# Patient Record
Sex: Female | Born: 1970 | Race: White | Hispanic: No | Marital: Married | Smoking: Never smoker
Health system: Southern US, Community
[De-identification: ages and names within clinical notes are randomized; demographics above are authoritative.]

## PROBLEM LIST (undated history)

## (undated) DIAGNOSIS — F40298 Other specified phobia: Secondary | ICD-10-CM

## (undated) DIAGNOSIS — IMO0001 Reserved for inherently not codable concepts without codable children: Secondary | ICD-10-CM

## (undated) DIAGNOSIS — E039 Hypothyroidism, unspecified: Secondary | ICD-10-CM

## (undated) DIAGNOSIS — F419 Anxiety disorder, unspecified: Secondary | ICD-10-CM

## (undated) DIAGNOSIS — Z8489 Family history of other specified conditions: Secondary | ICD-10-CM

## (undated) DIAGNOSIS — D649 Anemia, unspecified: Secondary | ICD-10-CM

## (undated) DIAGNOSIS — E079 Disorder of thyroid, unspecified: Secondary | ICD-10-CM

---

## 1997-10-30 ENCOUNTER — Encounter: Admission: RE | Admit: 1997-10-30 | Discharge: 1997-10-30 | Payer: Self-pay | Admitting: *Deleted

## 1999-04-29 ENCOUNTER — Other Ambulatory Visit: Admission: RE | Admit: 1999-04-29 | Discharge: 1999-04-29 | Payer: Self-pay | Admitting: Obstetrics & Gynecology

## 2000-05-30 ENCOUNTER — Other Ambulatory Visit: Admission: RE | Admit: 2000-05-30 | Discharge: 2000-05-30 | Payer: Self-pay | Admitting: Obstetrics & Gynecology

## 2001-06-28 ENCOUNTER — Other Ambulatory Visit: Admission: RE | Admit: 2001-06-28 | Discharge: 2001-06-28 | Payer: Self-pay | Admitting: Obstetrics & Gynecology

## 2002-07-03 ENCOUNTER — Other Ambulatory Visit: Admission: RE | Admit: 2002-07-03 | Discharge: 2002-07-03 | Payer: Self-pay | Admitting: Obstetrics & Gynecology

## 2005-01-15 ENCOUNTER — Emergency Department (HOSPITAL_COMMUNITY): Admission: EM | Admit: 2005-01-15 | Discharge: 2005-01-15 | Payer: Self-pay | Admitting: Emergency Medicine

## 2005-01-16 ENCOUNTER — Ambulatory Visit (HOSPITAL_COMMUNITY): Admission: RE | Admit: 2005-01-16 | Discharge: 2005-01-16 | Payer: Self-pay | Admitting: *Deleted

## 2005-03-22 ENCOUNTER — Inpatient Hospital Stay (HOSPITAL_COMMUNITY): Admission: RE | Admit: 2005-03-22 | Discharge: 2005-03-25 | Payer: Self-pay | Admitting: Obstetrics & Gynecology

## 2005-03-26 ENCOUNTER — Encounter: Admission: RE | Admit: 2005-03-26 | Discharge: 2005-04-25 | Payer: Self-pay | Admitting: Obstetrics & Gynecology

## 2005-04-26 ENCOUNTER — Encounter: Admission: RE | Admit: 2005-04-26 | Discharge: 2005-05-25 | Payer: Self-pay | Admitting: Obstetrics & Gynecology

## 2007-10-09 ENCOUNTER — Ambulatory Visit (HOSPITAL_COMMUNITY): Admission: RE | Admit: 2007-10-09 | Discharge: 2007-10-09 | Payer: Self-pay | Admitting: Obstetrics

## 2007-10-09 ENCOUNTER — Encounter (INDEPENDENT_AMBULATORY_CARE_PROVIDER_SITE_OTHER): Payer: Self-pay | Admitting: Obstetrics

## 2009-03-08 ENCOUNTER — Encounter: Admission: RE | Admit: 2009-03-08 | Discharge: 2009-03-08 | Payer: Self-pay | Admitting: Obstetrics & Gynecology

## 2009-03-15 ENCOUNTER — Encounter: Admission: RE | Admit: 2009-03-15 | Discharge: 2009-03-15 | Payer: Self-pay | Admitting: Obstetrics & Gynecology

## 2009-07-01 ENCOUNTER — Emergency Department (HOSPITAL_BASED_OUTPATIENT_CLINIC_OR_DEPARTMENT_OTHER): Admission: EM | Admit: 2009-07-01 | Discharge: 2009-07-01 | Payer: Self-pay | Admitting: Emergency Medicine

## 2009-07-08 ENCOUNTER — Encounter: Payer: Self-pay | Admitting: Cardiology

## 2009-07-08 ENCOUNTER — Ambulatory Visit: Payer: Self-pay

## 2010-05-03 NOTE — Procedures (Signed)
Summary: summary report  summary report   Imported By: Mirna Mires 07/15/2009 12:05:23  _____________________________________________________________________  External Attachment:    Type:   Image     Comment:   External Document

## 2010-06-21 ENCOUNTER — Other Ambulatory Visit: Payer: Self-pay | Admitting: Obstetrics & Gynecology

## 2010-06-21 DIAGNOSIS — Z1231 Encounter for screening mammogram for malignant neoplasm of breast: Secondary | ICD-10-CM

## 2010-06-27 LAB — DIFFERENTIAL
Basophils Absolute: 0 10*3/uL (ref 0.0–0.1)
Basophils Relative: 0 % (ref 0–1)
Eosinophils Absolute: 0 10*3/uL (ref 0.0–0.7)
Eosinophils Relative: 0 % (ref 0–5)
Lymphocytes Relative: 21 % (ref 12–46)
Lymphs Abs: 2.1 10*3/uL (ref 0.7–4.0)
Monocytes Absolute: 0.4 10*3/uL (ref 0.1–1.0)
Monocytes Relative: 4 % (ref 3–12)
Neutro Abs: 7.5 10*3/uL (ref 1.7–7.7)
Neutrophils Relative %: 74 % (ref 43–77)

## 2010-06-27 LAB — POCT CARDIAC MARKERS
CKMB, poc: <1 ng/mL — ABNORMAL LOW (ref 1.0–8.0)
Myoglobin, poc: 47.6 ng/mL (ref 12–200)
Troponin i, poc: <0.05 ng/mL (ref 0.00–0.09)

## 2010-06-27 LAB — T3, FREE: T3, Free: 3.2 pg/mL (ref 2.3–4.2)

## 2010-06-27 LAB — BASIC METABOLIC PANEL
BUN: 11 mg/dL (ref 6–23)
CO2: 28 mEq/L (ref 19–32)
Calcium: 9.6 mg/dL (ref 8.4–10.5)
Chloride: 104 mEq/L (ref 96–112)
Creatinine, Ser: 0.6 mg/dL (ref 0.4–1.2)
GFR calc Af Amer: >60 mL/min (ref >60)
GFR calc non Af Amer: >60 mL/min (ref >60)
Glucose, Bld: 102 mg/dL — ABNORMAL HIGH (ref 70–99)
Potassium: 3.7 mEq/L (ref 3.5–5.1)
Sodium: 140 mEq/L (ref 135–145)

## 2010-06-27 LAB — TSH: TSH: 2.904 u[IU]/mL (ref 0.350–4.500)

## 2010-06-27 LAB — CBC
HCT: 34.3 % — ABNORMAL LOW (ref 36.0–46.0)
Hemoglobin: 12 g/dL (ref 12.0–15.0)
MCHC: 35 g/dL (ref 30.0–36.0)
MCV: 82.7 fL (ref 78.0–100.0)
Platelets: 314 10*3/uL (ref 150–400)
RBC: 4.15 MIL/uL (ref 3.87–5.11)
RDW: 14.5 % (ref 11.5–15.5)
WBC: 10 10*3/uL (ref 4.0–10.5)

## 2010-06-27 LAB — T4: T4, Total: 7.9 ug/dL (ref 5.0–12.5)

## 2010-06-29 ENCOUNTER — Ambulatory Visit
Admission: RE | Admit: 2010-06-29 | Discharge: 2010-06-29 | Disposition: A | Discharge status: Home / Self Care | Payer: BC Managed Care – PPO | Source: Ambulatory Visit | Attending: Obstetrics & Gynecology | Admitting: Obstetrics & Gynecology

## 2010-06-29 DIAGNOSIS — Z1231 Encounter for screening mammogram for malignant neoplasm of breast: Secondary | ICD-10-CM

## 2010-07-01 ENCOUNTER — Other Ambulatory Visit: Payer: Self-pay | Admitting: Obstetrics & Gynecology

## 2010-07-01 DIAGNOSIS — R928 Other abnormal and inconclusive findings on diagnostic imaging of breast: Secondary | ICD-10-CM

## 2010-07-14 ENCOUNTER — Ambulatory Visit
Admission: RE | Admit: 2010-07-14 | Discharge: 2010-07-14 | Disposition: A | Discharge status: Home / Self Care | Payer: BC Managed Care – PPO | Source: Ambulatory Visit | Attending: Obstetrics & Gynecology | Admitting: Obstetrics & Gynecology

## 2010-07-14 DIAGNOSIS — R928 Other abnormal and inconclusive findings on diagnostic imaging of breast: Secondary | ICD-10-CM

## 2010-08-16 NOTE — Op Note (Signed)
[name], [name]           ACCOUNT NO.:  [id number]   MEDICAL RECORD NO.:  [id number]          PATIENT TYPE:  AMB   LOCATION:  SDC                           FACILITY:  [facility]   PHYSICIAN:  Lendon Colonel, MD   DATE OF BIRTH:  15-Feb-1971   DATE OF PROCEDURE:  DATE OF DISCHARGE:                               OPERATIVE REPORT   PREOPERATIVE DIAGNOSIS:  15-week intrauterine fetal demise.   POSTOPERATIVE DIAGNOSIS:  15-week intrauterine fetal demise.   PROCEDURE:  Suction dilatation and evacuation.   SURGEON:  Lendon Colonel, MD   ASSISTANTS:  None.   ANESTHESIA:  LMA.   FINDINGS:  A 15-week uterus, normal endometrial stripe postprocedure.   DISPOSITION:  Specimens to Pathology.   ESTIMATED BLOOD LOSS:  200 mL.   COMPLICATIONS:  None.   ANTIBIOTICS:  A 100 mg of IV doxycycline.   INDICATIONS:  This is a 40 year old G3, P2 diagnosed with a 15-week  missed AB, one day prior to procedure.  After risks, benefits, and  alternatives of the procedure was discussed, the patient opted for  suction D&E.   PROCEDURE IN DETAIL:  After informed consent was obtained, the patient  was taken to the operating room where LMA anesthesia was administered  without difficulty.  She was prepped and draped in normal sterile  fashion in the dorsal supine lithotomy position.  A bimanual examination  was performed to assess the size and position of the uterus and with  this expression of the 15-week fetus was noted. It was expressed intact.  The cord was clamped and cut, moderate bleeding was noted from the  uterus.  A sterile speculum was inserted into the vagina.  A single-  tooth tenaculum was used to grasp the anterior lip of the cervix.  The  cervix was easily dilated to a #35 Shawnie Pons and a #10 French suction  curette was inserted into the uterus and with several passes moderate  amount of placental tissue was obtained.  The bleeding was still noted  from the cervix.  A very gentle sharp  curettage was carried out, which  revealed a gritty cry , however, due to the amount of bleeding  ultrasound was called for.  Ultrasound assistance arrived into the room.  A small area of clot and questionable retained POCs was noted with some  active blood flow, a repeat suction curettage was carried out.  Improved  hemostasis was noted.  The endometrial stripe was noted to be  8 mm and no active blood flow to the uterus.  The procedure was then  terminated.  Sponge, lap, and needle counts were correct x3.  The  patient did receive 0.2 mg of IM Methergine prior to ultrasound arrival  which helped with hemostasis.  The patient was awakened and taken to the  recovery room in a stable condition.      Lendon Colonel, MD  Electronically Signed     KAF/MEDQ  D:  10/09/2007  T:  10/10/2007  Job:  [id number]

## 2010-08-19 NOTE — Discharge Summary (Signed)
[name], Tina Caldwell           ACCOUNT NO.:  [id number]   MEDICAL RECORD NO.:  [id number]          PATIENT TYPE:  INP   LOCATION:  9113                          FACILITY:  [facility]   PHYSICIAN:  Genia Del, M.D.DATE OF BIRTH:  1970-05-05   DATE OF ADMISSION:  03/22/2005  DATE OF DISCHARGE:  03/25/2005                                 DISCHARGE SUMMARY   ADMISSION DIAGNOSIS:  Thirty-eight-plus weeks with history of shoulder  dystocia.   DISCHARGE DIAGNOSES:  Thirty-eight-plus weeks with history of shoulder  dystocia.   INTERVENTION:  Primary elective low transverse cesarean section.   HOSPITAL COURSE:  The patient was admitted on the day of her surgery.  She  had a baby boy.  Apgars were 9 and 9.  Weight was 8 pounds 7 ounces.  Estimated blood loss was 800 mL.  No complications occurred during surgery.  Postoperative evaluation was unremarkable.  She had a postoperative  hemoglobin at 9.5.  She was discharged on postoperative day #3 with Tylox  p.r.n. Postoperative advice given.  She will follow up at the office in four  weeks.      Genia Del, M.D.  Electronically Signed     ML/MEDQ  D:  05/03/2005  T:  05/03/2005  Job:  [id number]

## 2010-08-19 NOTE — Op Note (Signed)
Tina Caldwell           ACCOUNT NO.:  [id number]   MEDICAL RECORD NO.:  [id number]          PATIENT TYPE:  INP   LOCATION:  9113                          [facility]ACILITY:  [facility]   PHYSICIAN:  Genia Del, M.D.DATE OF BIRTH:  [birth year]   DATE OF PROCEDURE:  03/22/2005  DATE OF DISCHARGE:                                 OPERATIVE REPORT   PREOPERATIVE DIAGNOSES:  1.  Thirty-eight-plus weeks gestation.  2.  History of previous shoulder dystocia.   POSTOPERATIVE DIAGNOSES:  1.  Thirty-eight-plus weeks gestation.  2.  History of previous shoulder dystocia.   OPERATION/PROCEDURE:  Primary elective low transverse cesarean section.   SURGEON:  Genia Del, M.D.   ASSISTANT:  Maxie Better, M.D.   ANESTHESIOLOGIST:  Burnett Corrente, M.D.   DESCRIPTION OF PROCEDURE:  Under spinal anesthesia, the patient is in 15-  degree left decubitus position.  She is prepped with Betadine on the  abdominal, suprapubic, vulvar, and vaginal areas and draped as usual.  The  bladder catheter is inserted.  We verified the level of anesthesia and it is  adequate.  We infiltrate 15 mL of Marcaine 25% plain at the level of the  future Pfannenstiel incision.  We then make a Pfannenstiel incision with a  scalpel.  We open the adipose tissue with a scalpel and then open the  aponeurosis transversely with the Mayo scissors.  We separate the  aponeurosis from the rectus muscles superiorly and inferiorly over the  midline.  We opened a parietal peritoneum longitudinally with the Metzenbaum  scissors.  We then open the visceral peritoneum transversely over the lower  uterine segment with Metzenbaum scissors.  The bladder is reclined downward.  The bladder retractor is put in place.  We made a low transverse hysterotomy  with a scalpel. Extension is done on each side with dressing scissors.  The  amniotic fluid is clear.  The fetus is in cephalic presentation.  Birth of a  baby boy at 1716  hours.  We notice a loose nuchal cord as well as a true  knot in the cord.  The cord is clamped and cut.  The baby was suctioned  The  baby is brought to the neonatal team. Apgars 9 and 9.  Weight 8 pounds 7  ounces.  Ancef 1 g IV was started in the IV fluids.  The placenta is  evacuated spontaneously.  It is complete with three vessels.  Uterine  revision is done.  Pitocin is started in the IV fluid.  We verify both  ovaries and tubes and they are all normal in size and appearance.  The  uterus is well-contracted.  We closed the hysterotomy with a first locked  running suture of Vicryl #0.  We make a second plain with a mattress stitch  of Vicryl #0 and one X stitch of Vicryl #0 is used toward the right side to  complete hemostasis.  We then irrigate and suction the abdominopelvic  cavity.  We verify hemostasis on the rectus muscles and bladder flap and it  is completed with the electrocautery where necessary.  The hysterotomy was  well closed and hemostasis is good at that level.  We then reapproximate the  rectus muscles on the midline with interrupted Vicryl #0 stitches and we  closed the aponeurosis with two half-running sutures of Vicryl #0.  Hemostasis  is completed on the adipose tissue with the electrocautery.  We then  reapproximate the skin with staples.  A dry dressing is applied.  The count  of sponges and instruments was complete x2.  The estimated blood loss was  800 mL.  No complications occurred and the patient was brought to the  recovery room in good status.      Genia Del, M.D.  Electronically Signed     ML/MEDQ  D:  03/22/2005  T:  03/23/2005  Job:  [id number]

## 2010-12-12 ENCOUNTER — Emergency Department (HOSPITAL_BASED_OUTPATIENT_CLINIC_OR_DEPARTMENT_OTHER)
Admission: EM | Admit: 2010-12-12 | Discharge: 2010-12-12 | Disposition: A | Discharge status: Home / Self Care | Payer: BC Managed Care – PPO | Attending: Emergency Medicine | Admitting: Emergency Medicine

## 2010-12-12 ENCOUNTER — Emergency Department (INDEPENDENT_AMBULATORY_CARE_PROVIDER_SITE_OTHER): Payer: BC Managed Care – PPO

## 2010-12-12 ENCOUNTER — Other Ambulatory Visit: Payer: Self-pay

## 2010-12-12 ENCOUNTER — Encounter (HOSPITAL_BASED_OUTPATIENT_CLINIC_OR_DEPARTMENT_OTHER): Payer: Self-pay

## 2010-12-12 DIAGNOSIS — R0989 Other specified symptoms and signs involving the circulatory and respiratory systems: Secondary | ICD-10-CM | POA: Insufficient documentation

## 2010-12-12 DIAGNOSIS — R0602 Shortness of breath: Secondary | ICD-10-CM

## 2010-12-12 DIAGNOSIS — R0609 Other forms of dyspnea: Secondary | ICD-10-CM | POA: Insufficient documentation

## 2010-12-12 DIAGNOSIS — R079 Chest pain, unspecified: Secondary | ICD-10-CM | POA: Insufficient documentation

## 2010-12-12 DIAGNOSIS — E079 Disorder of thyroid, unspecified: Secondary | ICD-10-CM | POA: Insufficient documentation

## 2010-12-12 DIAGNOSIS — IMO0001 Reserved for inherently not codable concepts without codable children: Secondary | ICD-10-CM

## 2010-12-12 HISTORY — DX: Disorder of thyroid, unspecified: E07.9

## 2010-12-12 LAB — DIFFERENTIAL
Basophils Absolute: 0 10*3/uL (ref 0.0–0.1)
Basophils Relative: 0 % (ref 0–1)
Eosinophils Absolute: 0.1 10*3/uL (ref 0.0–0.7)
Eosinophils Relative: 1 % (ref 0–5)
Lymphocytes Relative: 23 % (ref 12–46)
Lymphs Abs: 2.2 10*3/uL (ref 0.7–4.0)
Monocytes Absolute: 0.4 10*3/uL (ref 0.1–1.0)
Monocytes Relative: 4 % (ref 3–12)
Neutro Abs: 6.7 10*3/uL (ref 1.7–7.7)
Neutrophils Relative %: 72 % (ref 43–77)

## 2010-12-12 LAB — BASIC METABOLIC PANEL
BUN: 9 mg/dL (ref 6–23)
CO2: 23 mEq/L (ref 19–32)
Calcium: 9.1 mg/dL (ref 8.4–10.5)
Chloride: 103 mEq/L (ref 96–112)
Creatinine, Ser: 0.6 mg/dL (ref 0.50–1.10)
GFR calc Af Amer: >60 mL/min (ref >60)
GFR calc non Af Amer: >60 mL/min (ref >60)
Glucose, Bld: 92 mg/dL (ref 70–99)
Potassium: 3.1 mEq/L — ABNORMAL LOW (ref 3.5–5.1)
Sodium: 139 mEq/L (ref 135–145)

## 2010-12-12 LAB — CBC
HCT: 35.6 % — ABNORMAL LOW (ref 36.0–46.0)
Hemoglobin: 11.9 g/dL — ABNORMAL LOW (ref 12.0–15.0)
MCH: 27.4 pg (ref 26.0–34.0)
MCHC: 33.4 g/dL (ref 30.0–36.0)
MCV: 81.8 fL (ref 78.0–100.0)
Platelets: 278 10*3/uL (ref 150–400)
RBC: 4.35 MIL/uL (ref 3.87–5.11)
RDW: 13.6 % (ref 11.5–15.5)
WBC: 9.4 10*3/uL (ref 4.0–10.5)

## 2010-12-12 LAB — D-DIMER, QUANTITATIVE: D-Dimer, Quant: 0.36 ug/mL-FEU (ref 0.00–0.48)

## 2010-12-12 LAB — CARDIAC PANEL(CRET KIN+CKTOT+MB+TROPI)
CK, MB: 1.8 ng/mL (ref 0.3–4.0)
Relative Index: INVALID (ref 0.0–2.5)
Total CK: 63 U/L (ref 7–177)
Troponin I: <0.3 ng/mL (ref <0.30)

## 2010-12-12 NOTE — ED Notes (Signed)
MD at bedside. 

## 2010-12-12 NOTE — ED Notes (Signed)
Pt reports relief from chest pain and SHOB after application of O2.  Pt and spouse informed of plan of care.

## 2010-12-12 NOTE — ED Notes (Signed)
Pt reports onset of [facility] yesterday while walking.  She reports intermittent chest pain radiating under left breast.

## 2010-12-12 NOTE — ED Provider Notes (Signed)
History     CSN: [id number] Arrival date & time: 12/12/2010 10:02 AM  Chief Complaint  Patient presents with  . Chest Pain  . Respiratory Distress   HPI Pt reports yesterday while walking to her car, she felt like she was breathing hard and fast. She felt better with rest, did not have any chest pain at the time. States she got home and began to feel like she was having a hard time getting air in and out and unable to take a deep breath. She began to have a mild aching lower chest pain, radiating to the L lateral chest under her breast. She was still having mild symptoms this morning and decided to come to the ED for eval. Her symptoms have resolved since arrival.   Past Medical History  Diagnosis Date  . Thyroid disease     Past Surgical History  Procedure Date  . Cesarean section     No family history on file.  History  Substance Use Topics  . Smoking status: Never Smoker   . Smokeless tobacco: Not on file  . Alcohol Use: Yes     occ    OB History    Grav Para Term Preterm Abortions TAB SAB Ect Mult Living                  Review of Systems All other systems reviewed and are negative except as noted in HPI.   Physical Exam  BP 137/72  Pulse 75  Temp(Src) 98.5 F (36.9 C) (Oral)  Resp 20  Ht 5\' 6"  (1.676 m)  Wt 250 lb (113.399 kg)  BMI 40.35 kg/m2  SpO2 98%  LMP 11/25/2010  Physical Exam  Nursing note and vitals reviewed. Constitutional: She is oriented to person, place, and time. She appears well-developed and well-nourished.  HENT:  Head: Normocephalic and atraumatic.  Eyes: EOM are normal. Pupils are equal, round, and reactive to light.  Neck: Normal range of motion. Neck supple.  Cardiovascular: Normal rate, normal heart sounds and intact distal pulses.   Pulmonary/Chest: Effort normal and breath sounds normal. She exhibits tenderness (lower sternum and L lateral lower ribs).  Abdominal: Bowel sounds are normal. She exhibits no distension. There is  no tenderness.  Musculoskeletal: Normal range of motion. She exhibits no edema and no tenderness.  Neurological: She is alert and oriented to person, place, and time. She has normal strength. No cranial nerve deficit or sensory deficit.  Skin: Skin is warm and dry. No rash noted.  Psychiatric: She has a normal mood and affect.    ED Course  Procedures  MDM  Date: 12/12/2010  Rate: 68  Rhythm: normal sinus rhythm  QRS Axis: normal  Intervals: normal  ST/T Wave abnormalities: normal  Conduction Disutrbances:none  Narrative Interpretation:   Old EKG Reviewed: unchanged from 01 Jul 2009  1:52 PM Pt remains pain free, no dyspnea at this time. Labs and imaging unremarkable. Doubt life threatening etiology of dyspnea. No CAD or PE risk factors. Recommended PCP followup for further eval, Advised to returned to the ED for any worsening or worrisome symptoms.        Charles B. Bernette Mayers, MD 12/12/10 1354

## 2010-12-29 LAB — CBC
HCT: 40.8
Hemoglobin: 13.8
MCHC: 33.8
MCV: 86.1
Platelets: 278
RBC: 4.74
RDW: 14.5
WBC: 10.1

## 2010-12-29 LAB — TYPE AND SCREEN
ABO/RH(D): O POS
Antibody Screen: NEGATIVE

## 2010-12-29 LAB — ABO/RH: ABO/RH(D): O POS

## 2011-05-26 ENCOUNTER — Other Ambulatory Visit: Payer: Self-pay | Admitting: Obstetrics & Gynecology

## 2011-05-26 DIAGNOSIS — Z1231 Encounter for screening mammogram for malignant neoplasm of breast: Secondary | ICD-10-CM

## 2011-06-27 ENCOUNTER — Ambulatory Visit
Admission: RE | Admit: 2011-06-27 | Discharge: 2011-06-27 | Disposition: A | Discharge status: Home / Self Care | Payer: BC Managed Care – PPO | Source: Ambulatory Visit | Attending: Obstetrics & Gynecology | Admitting: Obstetrics & Gynecology

## 2011-06-27 DIAGNOSIS — Z1231 Encounter for screening mammogram for malignant neoplasm of breast: Secondary | ICD-10-CM

## 2011-06-30 ENCOUNTER — Ambulatory Visit: Payer: BC Managed Care – PPO

## 2011-07-17 ENCOUNTER — Ambulatory Visit: Payer: BC Managed Care – PPO

## 2012-02-22 ENCOUNTER — Other Ambulatory Visit: Payer: Self-pay | Admitting: Gastroenterology

## 2012-02-22 DIAGNOSIS — R1033 Periumbilical pain: Secondary | ICD-10-CM

## 2012-02-27 ENCOUNTER — Ambulatory Visit
Admission: RE | Admit: 2012-02-27 | Discharge: 2012-02-27 | Disposition: A | Discharge status: Home / Self Care | Payer: BC Managed Care – PPO | Source: Ambulatory Visit | Attending: Gastroenterology | Admitting: Gastroenterology

## 2012-02-27 DIAGNOSIS — R1033 Periumbilical pain: Secondary | ICD-10-CM

## 2012-02-27 MED ORDER — IOHEXOL 300 MG/ML  SOLN
125.0000 mL | Freq: Once | INTRAMUSCULAR | Status: AC | PRN
  Administered 2012-02-27: 125 mL via INTRAVENOUS

## 2012-03-05 ENCOUNTER — Other Ambulatory Visit: Payer: Self-pay | Admitting: Gastroenterology

## 2012-03-05 DIAGNOSIS — R1011 Right upper quadrant pain: Secondary | ICD-10-CM

## 2013-06-12 ENCOUNTER — Other Ambulatory Visit: Payer: Self-pay | Admitting: Gastroenterology

## 2013-06-12 DIAGNOSIS — R1310 Dysphagia, unspecified: Secondary | ICD-10-CM

## 2013-06-17 ENCOUNTER — Ambulatory Visit
Admission: RE | Admit: 2013-06-17 | Discharge: 2013-06-17 | Disposition: A | Discharge status: Home / Self Care | Payer: Managed Care, Other (non HMO) | Source: Ambulatory Visit | Attending: Gastroenterology | Admitting: Gastroenterology

## 2013-06-17 DIAGNOSIS — R1310 Dysphagia, unspecified: Secondary | ICD-10-CM

## 2013-10-22 ENCOUNTER — Other Ambulatory Visit: Payer: Self-pay

## 2013-10-22 DIAGNOSIS — Z1231 Encounter for screening mammogram for malignant neoplasm of breast: Secondary | ICD-10-CM

## 2013-10-30 ENCOUNTER — Encounter (INDEPENDENT_AMBULATORY_CARE_PROVIDER_SITE_OTHER): Payer: Self-pay

## 2013-10-30 ENCOUNTER — Ambulatory Visit
Admission: RE | Admit: 2013-10-30 | Discharge: 2013-10-30 | Disposition: A | Discharge status: Home / Self Care | Payer: Managed Care, Other (non HMO) | Source: Ambulatory Visit

## 2013-10-30 DIAGNOSIS — Z1231 Encounter for screening mammogram for malignant neoplasm of breast: Secondary | ICD-10-CM

## 2013-11-05 ENCOUNTER — Ambulatory Visit: Payer: Managed Care, Other (non HMO)

## 2014-05-12 ENCOUNTER — Other Ambulatory Visit: Payer: Self-pay | Admitting: Obstetrics & Gynecology

## 2014-05-28 ENCOUNTER — Encounter (HOSPITAL_COMMUNITY): Admission: RE | Payer: Self-pay | Source: Ambulatory Visit

## 2014-05-28 ENCOUNTER — Ambulatory Visit (HOSPITAL_COMMUNITY)
Admission: RE | Admit: 2014-05-28 | Payer: Managed Care, Other (non HMO) | Source: Ambulatory Visit | Admitting: Obstetrics & Gynecology

## 2014-05-28 SURGERY — ROBOTIC ASSISTED TOTAL HYSTERECTOMY
Anesthesia: General

## 2015-01-13 ENCOUNTER — Encounter: Payer: Self-pay | Admitting: Obstetrics & Gynecology

## 2015-02-04 ENCOUNTER — Other Ambulatory Visit: Payer: Self-pay | Admitting: Obstetrics & Gynecology

## 2015-02-15 NOTE — Patient Instructions (Addendum)
Your procedure is scheduled on:  Friday, NOV. 18, 2016  Enter through the Main Entrance of [facility] at: 11:30 A.M.  Pick up the phone at the desk and dial [phone].  Call this number if you have problems the morning of surgery: [phone].  Remember: Do NOT eat food:  AFTER MIDNIGHT THURSDAY Do NOT drink clear liquids after:  9:00 A.M. DAY OF SURGERY Take these medicines the morning of surgery with a SIP OF WATER:  THYROID, XANAX IF NEEDED   Do NOT wear jewelry (body piercing), metal hair clips/bobby pins, make-up, or nail polish. Do NOT wear lotions, powders, or perfumes.  You may wear deoderant. Do NOT shave for 48 hours prior to surgery. Do NOT bring valuables to the hospital. Contacts, dentures, or bridgework may not be worn into surgery. Leave suitcase in car.  After surgery it may be brought to your room.  For patients admitted to the hospital, checkout time is 11:00 AM the day of discharge.

## 2015-02-16 ENCOUNTER — Encounter (HOSPITAL_COMMUNITY): Payer: Self-pay

## 2015-02-16 ENCOUNTER — Encounter (HOSPITAL_COMMUNITY)
Admission: RE | Admit: 2015-02-16 | Discharge: 2015-02-16 | Disposition: A | Discharge status: Home / Self Care | Payer: BLUE CROSS/BLUE SHIELD | Source: Ambulatory Visit | Attending: Obstetrics & Gynecology | Admitting: Obstetrics & Gynecology

## 2015-02-16 DIAGNOSIS — N920 Excessive and frequent menstruation with regular cycle: Secondary | ICD-10-CM | POA: Diagnosis not present

## 2015-02-16 DIAGNOSIS — D649 Anemia, unspecified: Secondary | ICD-10-CM | POA: Diagnosis not present

## 2015-02-16 DIAGNOSIS — D259 Leiomyoma of uterus, unspecified: Secondary | ICD-10-CM | POA: Diagnosis not present

## 2015-02-16 DIAGNOSIS — Z6841 Body Mass Index (BMI) 40.0 and over, adult: Secondary | ICD-10-CM | POA: Diagnosis not present

## 2015-02-16 HISTORY — DX: Hypothyroidism, unspecified: E03.9

## 2015-02-16 HISTORY — DX: Family history of other specified conditions: Z84.89

## 2015-02-16 HISTORY — DX: Other specified phobia: F40.298

## 2015-02-16 HISTORY — DX: Anxiety disorder, unspecified: F41.9

## 2015-02-16 HISTORY — DX: Anemia, unspecified: D64.9

## 2015-02-16 HISTORY — DX: Reserved for inherently not codable concepts without codable children: IMO0001

## 2015-02-16 LAB — CBC
HCT: 32.3 % — ABNORMAL LOW (ref 36.0–46.0)
Hemoglobin: 10 g/dL — ABNORMAL LOW (ref 12.0–15.0)
MCH: 22.5 pg — ABNORMAL LOW (ref 26.0–34.0)
MCHC: 31 g/dL (ref 30.0–36.0)
MCV: 72.6 fL — ABNORMAL LOW (ref 78.0–100.0)
Platelets: 348 10*3/uL (ref 150–400)
RBC: 4.45 MIL/uL (ref 3.87–5.11)
RDW: 19.5 % — ABNORMAL HIGH (ref 11.5–15.5)
WBC: 7.6 10*3/uL (ref 4.0–10.5)

## 2015-02-16 LAB — TYPE AND SCREEN
ABO/RH(D): O POS
Antibody Screen: NEGATIVE

## 2015-02-18 ENCOUNTER — Encounter (HOSPITAL_COMMUNITY): Payer: Self-pay

## 2015-02-18 MED ORDER — DEXTROSE 5 % IV SOLN
3.0000 g | INTRAVENOUS | Status: AC
  Administered 2015-02-19: 3 g via INTRAVENOUS
  Filled 2015-02-18: qty 3000

## 2015-02-19 ENCOUNTER — Ambulatory Visit (HOSPITAL_COMMUNITY): Payer: BLUE CROSS/BLUE SHIELD | Admitting: Anesthesiology

## 2015-02-19 ENCOUNTER — Ambulatory Visit (HOSPITAL_COMMUNITY)
Admission: RE | Admit: 2015-02-19 | Discharge: 2015-02-20 | Disposition: A | Discharge status: Home / Self Care | Payer: BLUE CROSS/BLUE SHIELD | Source: Ambulatory Visit | Attending: Obstetrics & Gynecology | Admitting: Obstetrics & Gynecology

## 2015-02-19 ENCOUNTER — Encounter (HOSPITAL_COMMUNITY)
Admission: RE | Disposition: A | Discharge status: Home / Self Care | Payer: Self-pay | Source: Ambulatory Visit | Attending: Obstetrics & Gynecology

## 2015-02-19 DIAGNOSIS — D649 Anemia, unspecified: Secondary | ICD-10-CM | POA: Insufficient documentation

## 2015-02-19 DIAGNOSIS — N920 Excessive and frequent menstruation with regular cycle: Principal | ICD-10-CM | POA: Insufficient documentation

## 2015-02-19 DIAGNOSIS — Z6841 Body Mass Index (BMI) 40.0 and over, adult: Secondary | ICD-10-CM | POA: Insufficient documentation

## 2015-02-19 DIAGNOSIS — Z9889 Other specified postprocedural states: Secondary | ICD-10-CM

## 2015-02-19 DIAGNOSIS — D259 Leiomyoma of uterus, unspecified: Secondary | ICD-10-CM | POA: Insufficient documentation

## 2015-02-19 HISTORY — PX: ROBOTIC ASSISTED TOTAL HYSTERECTOMY WITH SALPINGECTOMY: SHX6679

## 2015-02-19 HISTORY — PX: BILATERAL SALPINGECTOMY: SHX5743

## 2015-02-19 LAB — PREGNANCY, URINE: Preg Test, Ur: NEGATIVE

## 2015-02-19 SURGERY — ROBOTIC ASSISTED TOTAL HYSTERECTOMY WITH SALPINGECTOMY
Anesthesia: General | Site: Abdomen | Laterality: Bilateral

## 2015-02-19 MED ORDER — ATROPINE SULFATE 0.1 MG/ML IJ SOLN
INTRAMUSCULAR | Status: AC
  Filled 2015-02-19: qty 10

## 2015-02-19 MED ORDER — PROPOFOL 10 MG/ML IV BOLUS
INTRAVENOUS | Status: AC
  Filled 2015-02-19: qty 20

## 2015-02-19 MED ORDER — HYDROMORPHONE HCL 1 MG/ML IJ SOLN
1.0000 mg | INTRAMUSCULAR | Status: DC | PRN
  Administered 2015-02-19: 1 mg via INTRAVENOUS
  Filled 2015-02-19: qty 1

## 2015-02-19 MED ORDER — SCOPOLAMINE 1 MG/3DAYS TD PT72
MEDICATED_PATCH | TRANSDERMAL | Status: AC
  Administered 2015-02-19: 1.5 mg via TRANSDERMAL
  Filled 2015-02-19: qty 1

## 2015-02-19 MED ORDER — OXYCODONE-ACETAMINOPHEN 5-325 MG PO TABS
1.0000 | ORAL_TABLET | ORAL | Status: DC | PRN
  Administered 2015-02-20: 2 via ORAL
  Administered 2015-02-20: 1 via ORAL
  Filled 2015-02-19: qty 1
  Filled 2015-02-19: qty 2

## 2015-02-19 MED ORDER — THYROID 81.25 MG PO TABS: 2.0000 | ORAL_TABLET | Freq: Every day | ORAL | Status: DC

## 2015-02-19 MED ORDER — FENTANYL CITRATE (PF) 100 MCG/2ML IJ SOLN
INTRAMUSCULAR | Status: DC | PRN
  Administered 2015-02-19 (×2): 100 ug via INTRAVENOUS
  Administered 2015-02-19: 50 ug via INTRAVENOUS
  Administered 2015-02-19: 100 ug via INTRAVENOUS
  Administered 2015-02-19: 50 ug via INTRAVENOUS
  Administered 2015-02-19: 100 ug via INTRAVENOUS

## 2015-02-19 MED ORDER — FENTANYL CITRATE (PF) 250 MCG/5ML IJ SOLN
INTRAMUSCULAR | Status: AC
  Filled 2015-02-19: qty 5

## 2015-02-19 MED ORDER — PROMETHAZINE HCL 25 MG/ML IJ SOLN: 6.2500 mg | INTRAMUSCULAR | Status: DC | PRN

## 2015-02-19 MED ORDER — SUCCINYLCHOLINE CHLORIDE 20 MG/ML IJ SOLN
INTRAMUSCULAR | Status: DC | PRN
  Administered 2015-02-19: 133 mg via INTRAVENOUS

## 2015-02-19 MED ORDER — LIDOCAINE HCL (CARDIAC) 20 MG/ML IV SOLN
INTRAVENOUS | Status: AC
  Filled 2015-02-19: qty 5

## 2015-02-19 MED ORDER — BUPIVACAINE HCL (PF) 0.25 % IJ SOLN
INTRAMUSCULAR | Status: DC | PRN
  Administered 2015-02-19: 20 mL

## 2015-02-19 MED ORDER — SODIUM CHLORIDE 0.9 % IV SOLN
INTRAVENOUS | Status: DC | PRN
  Administered 2015-02-19: 60 mL

## 2015-02-19 MED ORDER — GLYCOPYRROLATE 0.2 MG/ML IJ SOLN
INTRAMUSCULAR | Status: DC | PRN
  Administered 2015-02-19: 0.6 mg via INTRAVENOUS
  Administered 2015-02-19: 0.4 mg via INTRAVENOUS

## 2015-02-19 MED ORDER — LACTATED RINGERS IV SOLN
INTRAVENOUS | Status: DC
  Administered 2015-02-19: 22:00:00 via INTRAVENOUS

## 2015-02-19 MED ORDER — DEXAMETHASONE SODIUM PHOSPHATE 10 MG/ML IJ SOLN
INTRAMUSCULAR | Status: DC | PRN
  Administered 2015-02-19: 4 mg via INTRAVENOUS

## 2015-02-19 MED ORDER — HYDROMORPHONE HCL 1 MG/ML IJ SOLN: 0.2500 mg | INTRAMUSCULAR | Status: DC | PRN

## 2015-02-19 MED ORDER — LACTATED RINGERS IV SOLN
INTRAVENOUS | Status: DC
  Administered 2015-02-19 (×2): via INTRAVENOUS

## 2015-02-19 MED ORDER — ONDANSETRON HCL 4 MG/2ML IJ SOLN
INTRAMUSCULAR | Status: DC | PRN
  Administered 2015-02-19: 4 mg via INTRAVENOUS

## 2015-02-19 MED ORDER — ATROPINE SULFATE 0.4 MG/ML IJ SOLN
INTRAMUSCULAR | Status: DC | PRN
  Administered 2015-02-19: .1 mg via INTRAVENOUS

## 2015-02-19 MED ORDER — SCOPOLAMINE 1 MG/3DAYS TD PT72: 1.0000 | MEDICATED_PATCH | TRANSDERMAL | Status: DC

## 2015-02-19 MED ORDER — SCOPOLAMINE 1 MG/3DAYS TD PT72
1.0000 | MEDICATED_PATCH | Freq: Once | TRANSDERMAL | Status: DC
  Administered 2015-02-19: 1.5 mg via TRANSDERMAL

## 2015-02-19 MED ORDER — MIDAZOLAM HCL 2 MG/2ML IJ SOLN
INTRAMUSCULAR | Status: AC
  Filled 2015-02-19: qty 2

## 2015-02-19 MED ORDER — ROCURONIUM BROMIDE 100 MG/10ML IV SOLN
INTRAVENOUS | Status: DC | PRN
  Administered 2015-02-19: 20 mg via INTRAVENOUS
  Administered 2015-02-19: 50 mg via INTRAVENOUS
  Administered 2015-02-19: 20 mg via INTRAVENOUS

## 2015-02-19 MED ORDER — LIDOCAINE HCL (CARDIAC) 20 MG/ML IV SOLN
INTRAVENOUS | Status: DC | PRN
  Administered 2015-02-19: 100 mg via INTRAVENOUS

## 2015-02-19 MED ORDER — ROPIVACAINE HCL 5 MG/ML IJ SOLN
INTRAMUSCULAR | Status: AC
  Filled 2015-02-19: qty 30

## 2015-02-19 MED ORDER — NEOSTIGMINE METHYLSULFATE 10 MG/10ML IV SOLN
INTRAVENOUS | Status: DC | PRN
  Administered 2015-02-19: 4 mg via INTRAVENOUS

## 2015-02-19 MED ORDER — BUPIVACAINE HCL (PF) 0.25 % IJ SOLN
INTRAMUSCULAR | Status: AC
  Filled 2015-02-19: qty 30

## 2015-02-19 MED ORDER — IBUPROFEN 600 MG PO TABS: 600.0000 mg | ORAL_TABLET | Freq: Four times a day (QID) | ORAL | Status: DC | PRN

## 2015-02-19 MED ORDER — HYDROMORPHONE HCL 1 MG/ML IJ SOLN
INTRAMUSCULAR | Status: DC | PRN
  Administered 2015-02-19 (×2): 0.5 mg via INTRAVENOUS

## 2015-02-19 MED ORDER — PROPOFOL 10 MG/ML IV BOLUS
INTRAVENOUS | Status: DC | PRN
  Administered 2015-02-19: 150 mg via INTRAVENOUS

## 2015-02-19 MED ORDER — NEOSTIGMINE METHYLSULFATE 10 MG/10ML IV SOLN
INTRAVENOUS | Status: AC
  Filled 2015-02-19: qty 1

## 2015-02-19 MED ORDER — ONDANSETRON HCL 4 MG/2ML IJ SOLN
INTRAMUSCULAR | Status: AC
  Filled 2015-02-19: qty 2

## 2015-02-19 MED ORDER — LACTATED RINGERS IR SOLN
Status: DC | PRN
  Administered 2015-02-19: 3000 mL

## 2015-02-19 MED ORDER — GLYCOPYRROLATE 0.2 MG/ML IJ SOLN
INTRAMUSCULAR | Status: AC
  Filled 2015-02-19: qty 3

## 2015-02-19 MED ORDER — DEXAMETHASONE SODIUM PHOSPHATE 4 MG/ML IJ SOLN
INTRAMUSCULAR | Status: AC
  Filled 2015-02-19: qty 1

## 2015-02-19 MED ORDER — HYDROMORPHONE HCL 1 MG/ML IJ SOLN
INTRAMUSCULAR | Status: AC
  Filled 2015-02-19: qty 1

## 2015-02-19 MED ORDER — SODIUM CHLORIDE 0.9 % IJ SOLN
INTRAMUSCULAR | Status: AC
  Filled 2015-02-19: qty 50

## 2015-02-19 MED ORDER — MIDAZOLAM HCL 2 MG/2ML IJ SOLN
INTRAMUSCULAR | Status: DC | PRN
  Administered 2015-02-19: 2 mg via INTRAVENOUS

## 2015-02-19 SURGICAL SUPPLY — 56 items
BARRIER ADHS 3X4 INTERCEED (GAUZE/BANDAGES/DRESSINGS) ×2 IMPLANT
BRR ADH 4X3 ABS CNTRL BYND (GAUZE/BANDAGES/DRESSINGS) ×1
CATH FOLEY 3WAY  5CC 16FR (CATHETERS) ×1
CATH FOLEY 3WAY 5CC 16FR (CATHETERS) ×1 IMPLANT
CLOTH BEACON ORANGE TIMEOUT ST (SAFETY) ×2 IMPLANT
CONT PATH 16OZ SNAP LID 3702 (MISCELLANEOUS) ×2 IMPLANT
COVER BACK TABLE 60X90IN (DRAPES) ×4 IMPLANT
COVER TIP SHEARS 8 DVNC (MISCELLANEOUS) ×1 IMPLANT
COVER TIP SHEARS 8MM DA VINCI (MISCELLANEOUS) ×1
DECANTER SPIKE VIAL GLASS SM (MISCELLANEOUS) ×4 IMPLANT
DEFOGGER SCOPE WARMER CLEARIFY (MISCELLANEOUS) ×1 IMPLANT
DRAPE WARM FLUID 44X44 (DRAPE) ×1 IMPLANT
DURAPREP 26ML APPLICATOR (WOUND CARE) ×2 IMPLANT
ELECT REM PT RETURN 9FT ADLT (ELECTROSURGICAL) ×2
ELECTRODE REM PT RTRN 9FT ADLT (ELECTROSURGICAL) ×1 IMPLANT
GAUZE VASELINE 3X9 (GAUZE/BANDAGES/DRESSINGS) IMPLANT
GLOVE BIO SURGEON STRL SZ 6.5 (GLOVE) ×2 IMPLANT
GLOVE BIO SURGEON STRL SZ7 (GLOVE) ×2 IMPLANT
GLOVE BIOGEL PI IND STRL 7.0 (GLOVE) ×4 IMPLANT
GLOVE BIOGEL PI INDICATOR 7.0 (GLOVE) ×4
IV STOPCOCK 4 WAY 40  W/Y SET (IV SOLUTION)
IV STOPCOCK 4 WAY 40 W/Y SET (IV SOLUTION) IMPLANT
KIT ACCESSORY DA VINCI DISP (KITS) ×1
KIT ACCESSORY DVNC DISP (KITS) ×1 IMPLANT
LEGGING LITHOTOMY PAIR STRL (DRAPES) ×2 IMPLANT
LIQUID BAND (GAUZE/BANDAGES/DRESSINGS) ×3 IMPLANT
OCCLUDER COLPOPNEUMO (BALLOONS) ×2 IMPLANT
PACK ROBOT WH (CUSTOM PROCEDURE TRAY) ×2 IMPLANT
PACK ROBOTIC GOWN (GOWN DISPOSABLE) ×2 IMPLANT
PAD POSITIONING PINK XL (MISCELLANEOUS) ×2 IMPLANT
PAD PREP 24X48 CUFFED NSTRL (MISCELLANEOUS) ×4 IMPLANT
SET CYSTO W/LG BORE CLAMP LF (SET/KITS/TRAYS/PACK) IMPLANT
SET IRRIG TUBING LAPAROSCOPIC (IRRIGATION / IRRIGATOR) ×3 IMPLANT
SET TRI-LUMEN FLTR TB AIRSEAL (TUBING) ×1 IMPLANT
SUT VIC AB 0 CT1 27 (SUTURE) ×4
SUT VIC AB 0 CT1 27XBRD ANBCTR (SUTURE) ×2 IMPLANT
SUT VIC AB 4-0 PS2 27 (SUTURE) ×4 IMPLANT
SUT VICRYL 0 UR6 27IN ABS (SUTURE) ×4 IMPLANT
SUT VLOC 180 0 9IN  GS21 (SUTURE)
SUT VLOC 180 0 9IN GS21 (SUTURE) IMPLANT
SYR 50ML LL SCALE MARK (SYRINGE) ×2 IMPLANT
SYSTEM CONVERTIBLE TROCAR (TROCAR) ×1 IMPLANT
TIP RUMI ORANGE 6.7MMX12CM (TIP) IMPLANT
TIP UTERINE 5.1X6CM LAV DISP (MISCELLANEOUS) IMPLANT
TIP UTERINE 6.7X10CM GRN DISP (MISCELLANEOUS) IMPLANT
TIP UTERINE 6.7X6CM WHT DISP (MISCELLANEOUS) IMPLANT
TIP UTERINE 6.7X8CM BLUE DISP (MISCELLANEOUS) ×2 IMPLANT
TOWEL OR 17X24 6PK STRL BLUE (TOWEL DISPOSABLE) ×7 IMPLANT
TROCAR 12M 150ML BLUNT (TROCAR) IMPLANT
TROCAR DISP BLADELESS 8 DVNC (TROCAR) ×1 IMPLANT
TROCAR DISP BLADELESS 8MM (TROCAR) ×1
TROCAR PORT AIRSEAL 5X120 (TROCAR) IMPLANT
TROCAR XCEL 12X100 BLDLESS (ENDOMECHANICALS) ×2 IMPLANT
TROCAR XCEL NON-BLD 5MMX100MML (ENDOMECHANICALS) ×2 IMPLANT
WARMER LAPAROSCOPE (MISCELLANEOUS) ×2 IMPLANT
WATER STERILE IRR 1000ML POUR (IV SOLUTION) ×4 IMPLANT

## 2015-02-19 NOTE — Anesthesia Procedure Notes (Signed)
Procedure Name: Intubation Date/Time: 02/19/2015 1:12 PM Performed by: [employer], Sheron Nightingale Pre-anesthesia Checklist: Patient identified, Timeout performed, Emergency Drugs available, Suction available and Patient being monitored Patient Re-evaluated:Patient Re-evaluated prior to inductionOxygen Delivery Method: Circle system utilized Preoxygenation: Pre-oxygenation with 100% oxygen Intubation Type: IV induction Ventilation: Mask ventilation without difficulty Laryngoscope Size: Mac and 3 Grade View: Grade II Number of attempts: 1 Airway Equipment and Method: Patient positioned with wedge pillow Placement Confirmation: ETT inserted through vocal cords under direct vision,  positive ETCO2 and breath sounds checked- equal and bilateral Secured at: 21 cm Dental Injury: Teeth and Oropharynx as per pre-operative assessment

## 2015-02-19 NOTE — H&P (Signed)
Tina Caldwell is an 44 y.o. female G60P2A1  RP:  Refractory menometro with fibroids for Robotic TLH/Bilat Salpingectomy  Pertinent Gynecological History: Menses: menometro Bleeding: intermenstrual bleeding Contraception: condoms Blood transfusions: none Sexually transmitted diseases: no past history Previous GYN Procedures: DNC  Last mammogram: normal  Last pap: normal  Ob History: G3P2A1 C/S   Menstrual History:  Patient's last menstrual period was 02/10/2015.    Past Medical History  Diagnosis Date  . Thyroid disease   . Shortness of breath dyspnea   . Hypothyroidism   . Anxiety   . Anemia   . Specific phobia     Patient is unable to swallow pills  . Family history of adverse reaction to anesthesia     Mother developed thrombotic thrombocytopenic purpura after anesthesia, patient states Levaquin and Rocephin contributed to mothers problems    Past Surgical History  Procedure Laterality Date  . Cesarean section      No family history on file.  Social History:  reports that she has never smoked. She has never used smokeless tobacco. She reports that she drinks alcohol. She reports that she does not use illicit drugs.  Allergies: No Known Allergies  Prescriptions prior to admission  Medication Sig Dispense Refill Last Dose  . ALPRAZolam (XANAX) 0.5 MG tablet Take 0.5 mg by mouth 2 (two) times daily as needed for anxiety.   02/19/2015 at 0815  . furosemide (LASIX) 20 MG tablet Take 20 mg by mouth daily.   Past Week at Unknown time  . Iron-Folic Acid-Vit 123456 (IRON FORMULA PO) Take 10 mg by mouth 2 (two) times daily.   02/18/2015 at Unknown time  . progesterone (PROMETRIUM) 200 MG capsule Take 200 mg by mouth at bedtime as needed (only takes during her cycle).   Past Month at Unknown time  . Thyroid 81.25 MG TABS Take 2 tablets by mouth daily.    02/19/2015 at 0815    ROS Neg  Blood pressure 128/72, pulse 76, temperature 97.7 F (36.5 C), temperature source  Oral, resp. rate 18, last menstrual period 02/10/2015, SpO2 100 %. Physical Exam   Pelvic US Uterine myomas with overall ut volume of 253.5 cc.  Ovaries wnl x 2.  Results for orders placed or performed during the hospital encounter of 02/19/15 (from the past 24 hour(s))  Pregnancy, urine     Status: None   Collection Time: 02/19/15 11:24 AM  Result Value Ref Range   Preg Test, Ur NEGATIVE NEGATIVE    Assessment/Plan: Menometro resistant to medical Rxs with uterine myomas for Robotic TLH/Bilat Salpingectomy.  Surgery and risks thoroughly reviewed.  Tiernan Suto,MARIE-LYNE 02/19/2015, 12:49 PM

## 2015-02-19 NOTE — Op Note (Signed)
02/19/2015  5:00 PM  PATIENT:  Tina Caldwell  44 y.o. female  PRE-OPERATIVE DIAGNOSIS:  Menorrhagia, Uterine Fibroids, Anemia  POST-OPERATIVE DIAGNOSIS:  Menorrhagia, Uterine Fibroids, Anemia  PROCEDURE:  Procedure(s): ROBOTIC ASSISTED TOTAL HYSTERECTOMY  BILATERAL SALPINGECTOMY  SURGEON:  Surgeon(s): Princess Bruins, MD  ASSISTANT: Julianne Handler   ANESTHESIA:   general   PROCEDURE:  Under general anesthesia with endotracheal intubation. The patient is in lithotomy position.  She is prepped with ChloraPrep on the abdomen and Betadine on the suprapubic, vulvar and vaginal areas.  The vaginal exam reveals an anteverted uterus, no adnexal mass. The weighted speculum is inserted in the vagina and the anterior lip of the cervix is grasped with a tenaculum. The hysterometry is at 9 cm, we therefore used a #8 Rumi with a medium-size Koh ring. Those are put in place easily. The tenaculum and speculum are removed. The Foley is put in place in the bladder.  We go to the abdomen. The supra umbilical area is infiltrated with Marcaine with quarter plain. A 1.5 cm incision is made at that level with a scalpel. We opened the aponeurosis under direct vision with Mayo scissors. The parietal peritoneum is opened bluntly with the finger. A pursestring stitch of Vicryl 0 was done on the aponeurosis. The Sheryle Hail is inserted at that level. Under direct vision and pneumoperitoneum is created with CO2.  The camera is inserted at that level. The anterior wall of the abdomen and pelvis are free of adhesions.  We used a semicircular configuration for port placement with 2 robotic ports on the right, 1 robotic port on the lower left and the assistant port on the upper left.  Each site was infiltrated with Marcaine on quarter plain and a small incision was made with the scalpel.  The ports were inserted under direct vision.  The robot was docked from the right side. We inserted under direct vision, the EndoShears  scissor on the right, the PK on the left and the Prograsp on the third arm.        We then went to the console.  Pictures were taken of the uterus presenting multiple fibroids and possible adenomyosis. Both ovaries showed simple cysts which were drained.  Both tubes were normal to inspection.  No other lesion was present in the pelvis.  No abnormality was seen in the abdomen.  Both ureters were well visualized in normal anatomy position.        We started on the left side with catheterization and section of the left mesosalpinx. We then cauterized, infection, the left round ligament. We cauterized infection. The left utero-ovarian ligament.  We opened the broad ligament close to the uterus and we continued opening the anterior visceral peritoneum to the midline of the uterus.  We proceeded exactly the same way on the right side. Small paratubal cysts were also present which were removed.  We continued opening the visceral peritoneum anteriorly and then descended the bladder past the [facility] ring. Because of previous C-section, there were adhesions between the lower uterine segment and the bladder.  We then cauterized the right uterine artery at the [facility] ring, as well as on the lateral uterus for the backflow. We sectioned the right uterine artery.  We proceeded the same way on the left side.  The uterus was very soft and difficult to manipulate probably secondary to adenomyosis, the Prograsp clamp was very useful to mobilize the uterus.  We opened the upper vagina above the Koh ring anteriorly,  then posteriorly and finally on each side with the tip of the Endo Shears scissors.        The uterus was completely detached with the cervix and both tubes and passed vaginally.  Its weight was just above 340 g. It was sent to pathology.  The occluder was put back in the vagina.  We switched the instruments to the needle driver in the right hand with the long tip in the left hand and the PK in the third arm.  A V-lock suture 0  at 9 inches was inserted in the abdomen.  The vaginal vault was closed with the V-lock in a running sutures starting at the right angle, all the way to the left angle and then back.  A second V-lock 0 at 6 inches was used to complete the closure of the vaginal vault.  We irrigated and suctioned. The abdominopelvic cavity. Hemostasis was adequate at all levels.  Both ureters were presenting good peristalsis. The 2 sutures were parked on the left abdominal wall.        We removed all robotic instruments.  The robot was undocked.  We went to laparoscopy time.  The 2 needles were removed from the abdomen.  We irrigated and suctioned the abdominopelvic cavities.  Hemostasis was confirmed at all levels again.  Pubivicaine was irrigated in the abdominopelvic cavity The laparoscopy instruments were removed.  The ports were removed under direct vision.  The CO2 was evacuated.  The supraumbilical incision was closed by attaching the pursestring stitch at the aponeurosis.  All incisions were closed with a subcuticular suture of Monocryl 3-0.  Dermabond was added on all incisions.  The occluder was removed from the vagina.  The patient was brought to recovery room in good and stable status.   ESTIMATED BLOOD LOSS: 200 cc   Intake/Output Summary (Last 24 hours) at 02/19/15 1700 Last data filed at 02/19/15 1639  Gross per 24 hour  Intake   1300 ml  Output    400 ml  Net    900 ml     BLOOD ADMINISTERED:none   LOCAL MEDICATIONS USED:  MARCAINE    and BUPIVICAINE   SPECIMEN:  Source of Specimen:  Uterus with cervix and bilateral tubes  DISPOSITION OF SPECIMEN:  PATHOLOGY  COUNTS:  YES  PLAN OF CARE: Transfer to PACU  Princess Bruins MD  02/19/2015 at 5:03 pm

## 2015-02-19 NOTE — OR Nursing (Signed)
Concern about position of patients arms pre and post op. Dr Jillyn Hidden assessed pre and post op.

## 2015-02-19 NOTE — Transfer of Care (Signed)
Immediate Anesthesia Transfer of Care Note  Patient: Tina Caldwell  Procedure(s) Performed: Procedure(s): ROBOTIC ASSISTED TOTAL HYSTERECTOMY  (Bilateral) BILATERAL SALPINGECTOMY (Bilateral)  Patient Location: PACU  Anesthesia Type:General  Level of Consciousness: awake, alert  and oriented  Airway & Oxygen Therapy: Patient Spontanous Breathing and Patient connected to nasal cannula oxygen  Post-op Assessment: Report given to RN and Post -op Vital signs reviewed and stable  Post vital signs: Reviewed and stable  Last Vitals:  Filed Vitals:   02/19/15 1129  BP: 128/72  Pulse: 76  Temp: 36.5 C  Resp: 18    Complications: No apparent anesthesia complications

## 2015-02-19 NOTE — Anesthesia Postprocedure Evaluation (Signed)
  Anesthesia Post-op Note  Patient: Tina Caldwell  Procedure(s) Performed: Procedure(s) (LRB): ROBOTIC ASSISTED TOTAL HYSTERECTOMY  (Bilateral) BILATERAL SALPINGECTOMY (Bilateral)  Patient Location: PACU  Anesthesia Type: General  Level of Consciousness: awake and alert   Airway and Oxygen Therapy: Patient Spontanous Breathing  Post-op Pain: mild  Post-op Assessment: Post-op Vital signs reviewed, Patient's Cardiovascular Status Stable, Respiratory Function Stable, Patent Airway and No signs of Nausea or vomiting  Last Vitals:  Filed Vitals:   02/19/15 1730  BP: 135/80  Pulse: 53  Temp: 36.7 C  Resp: 12    Post-op Vital Signs: stable   Complications: No apparent anesthesia complications She is doing great post op, minimal O2 per nasal cannula, denies pain

## 2015-02-19 NOTE — Anesthesia Preprocedure Evaluation (Addendum)
Anesthesia Evaluation  Patient identified by MRN, date of birth, ID band Patient awake    Reviewed: Allergy & Precautions, NPO status , Patient's Chart, lab work & pertinent test results  History of Anesthesia Complications Negative for: history of anesthetic complications  Airway Mallampati: I  TM Distance: >3 FB Neck ROM: Full    Dental  (+) Teeth Intact, Dental Advisory Given   Pulmonary shortness of breath,    Pulmonary exam normal breath sounds clear to auscultation       Cardiovascular negative cardio ROS Normal cardiovascular exam     Neuro/Psych PSYCHIATRIC DISORDERS Anxiety negative neurological ROS     GI/Hepatic negative GI ROS, Neg liver ROS,   Endo/Other  Hypothyroidism Morbid obesity  Renal/GU negative Renal ROS     Musculoskeletal   Abdominal   Peds  Hematology  (+) anemia ,   Anesthesia Other Findings Type and Screen available  Reproductive/Obstetrics                            Anesthesia Physical Anesthesia Plan  ASA: III  Anesthesia Plan: General   Post-op Pain Management:    Induction: Intravenous  Airway Management Planned: Oral ETT  Additional Equipment:   Intra-op Plan:   Post-operative Plan: Extubation in OR  Informed Consent: I have reviewed the patients History and Physical, chart, labs and discussed the procedure including the risks, benefits and alternatives for the proposed anesthesia with the patient or authorized representative who has indicated his/her understanding and acceptance.   Dental advisory given  Plan Discussed with: Anesthesiologist and CRNA  Anesthesia Plan Comments:        Anesthesia Quick Evaluation

## 2015-02-20 DIAGNOSIS — N920 Excessive and frequent menstruation with regular cycle: Secondary | ICD-10-CM | POA: Diagnosis not present

## 2015-02-20 LAB — CBC
HCT: 28.4 % — ABNORMAL LOW (ref 36.0–46.0)
Hemoglobin: 8.7 g/dL — ABNORMAL LOW (ref 12.0–15.0)
MCH: 22.5 pg — ABNORMAL LOW (ref 26.0–34.0)
MCHC: 30.6 g/dL (ref 30.0–36.0)
MCV: 73.6 fL — ABNORMAL LOW (ref 78.0–100.0)
Platelets: 277 10*3/uL (ref 150–400)
RBC: 3.86 MIL/uL — ABNORMAL LOW (ref 3.87–5.11)
RDW: 18.8 % — ABNORMAL HIGH (ref 11.5–15.5)
WBC: 13.6 10*3/uL — ABNORMAL HIGH (ref 4.0–10.5)

## 2015-02-20 MED ORDER — OXYCODONE-ACETAMINOPHEN 7.5-325 MG PO TABS
1.0000 | ORAL_TABLET | ORAL | Status: DC | PRN
Start: 2015-02-20 — End: 2016-11-17

## 2015-02-20 NOTE — Progress Notes (Signed)
Discharge instructions reviewed with patient.  Patient states understanding of home care, medications, activity, signs/symptoms to report to MD and return MD office visit.  Patients significant other and family will assist with her care @ home.  No home  equipment needed, patient has prescriptions and all personal belongings.  Patient discharged per wheelchair in stable condition with staff without incident.  

## 2015-02-20 NOTE — Discharge Summary (Signed)
  Physician Discharge Summary  Patient ID: JANEIRY DICECCO MRN: [id number] DOB/AGE: 07-Feb-1971 44 y.o.  Admit date: 02/19/2015 Discharge date: 02/20/2015  Admission Diagnoses: Menorrhagia, Uterine Fibroids, Anemia  Discharge Diagnoses: Menorrhagia, Uterine Fibroids, Anemia        Active Problems:   Postoperative state   Discharged Condition: good  Hospital Course: good  Consults: None  Treatments: surgery: Robotic Total Laparoscopic Hysterectomy with Bilateral Salpingectomy  Disposition: 01-Home or Self Care     Medication List    STOP taking these medications        progesterone 200 MG capsule  Commonly known as:  PROMETRIUM      TAKE these medications        ALPRAZolam 0.5 MG tablet  Commonly known as:  XANAX  Take 0.5 mg by mouth 2 (two) times daily as needed for anxiety.     furosemide 20 MG tablet  Commonly known as:  LASIX  Take 20 mg by mouth daily.     IRON FORMULA PO  Take 10 mg by mouth 2 (two) times daily.     oxyCODONE-acetaminophen 7.5-325 MG tablet  Commonly known as:  PERCOCET  Take 1 tablet by mouth every 4 (four) hours as needed for severe pain.     Thyroid 81.25 MG Tabs  Take 2 tablets by mouth daily.           Follow-up Information    Follow up with Tran Arzuaga,MARIE-LYNE, MD In 3 weeks.   Specialty:  Obstetrics and Gynecology   Contact information:   [place] [address] [phone]       Signed: Princess Bruins, MD 02/20/2015, 12:51 PM

## 2015-02-20 NOTE — Progress Notes (Signed)
1 Day Post-Op Procedure(s) (LRB): ROBOTIC ASSISTED TOTAL HYSTERECTOMY  (Bilateral) BILATERAL SALPINGECTOMY (Bilateral)  Subjective: Patient reports that pain is well managed.  Tolerating normal diet as tolerated  diet without difficulty. No nausea / vomiting.  Ambulating and voiding.  Objective: BP 101/59 mmHg  Pulse 72  Temp(Src) 98.2 F (36.8 C) (Oral)  Resp 16  Ht 5\' 6"  (1.676 m)  Wt 293 lb (132.904 kg)  BMI 47.31 kg/m2  SpO2 100%  LMP 02/10/2015 Lungs: clear Heart: normal rate and rhythm Abdomen:soft and appropriately tender Extremities: Homans sign is negative, no sign of DVT Incision: healing well  Assessment: s/p Procedure(s): ROBOTIC ASSISTED TOTAL HYSTERECTOMY  BILATERAL SALPINGECTOMY: stable, progressing well and tolerating diet  Plan: Discharge home  LOS: 1 day    Veola Cafaro,MARIE-LYNE 02/20/2015, 12:47 PM

## 2015-02-20 NOTE — Anesthesia Postprocedure Evaluation (Signed)
Anesthesia Post Note  Patient: Tina Caldwell  Procedure(s) Performed: Procedure(s) (LRB): ROBOTIC ASSISTED TOTAL HYSTERECTOMY  (Bilateral) BILATERAL SALPINGECTOMY (Bilateral)  Anesthesia type: General  Patient location: [facility]  Post pain: Pain level controlled  Post assessment: Post-op Vital signs reviewed  Last Vitals:  Filed Vitals:   02/20/15 1355  BP: 103/52  Pulse: 79  Temp: 36.6 C  Resp: 18    Post vital signs: Reviewed  Level of consciousness: sedated  Complications: No apparent anesthesia complications

## 2015-02-20 NOTE — Discharge Instructions (Signed)
Total Laparoscopic Hysterectomy, Care After °Refer to this sheet in the next few weeks. These instructions provide you with information on caring for yourself after your procedure. Your health care provider may also give you more specific instructions. Your treatment has been planned according to current medical practices, but problems sometimes occur. Call your health care provider if you have any problems or questions after your procedure. °WHAT TO EXPECT AFTER THE PROCEDURE °· Pain and bruising at the incision sites. You will be given pain medicine to control it. °· Menopausal symptoms such as hot flashes, night sweats, and insomnia if your ovaries were removed. °· Sore throat from the breathing tube that was inserted during surgery. °HOME CARE INSTRUCTIONS °· Only take over-the-counter or prescription medicines for pain, discomfort, or fever as directed by your health care provider.   °· Do not take aspirin. It can cause bleeding.   °· Do not drive when taking pain medicine.   °· Follow your health care provider's advice regarding diet, exercise, lifting, driving, and general activities.   °· Resume your usual diet as directed and allowed.   °· Get plenty of rest and sleep.   °· Do not douche, use tampons, or have sexual intercourse for at least 6 weeks, or until your health care provider gives you permission.   °· Change your bandages (dressings) as directed by your health care provider.   °· Monitor your temperature and notify your health care provider of a fever.   °· Take showers instead of baths for 2-3 weeks.   °· Do not drink alcohol until your health care provider gives you permission.   °· If you develop constipation, you may take a mild laxative with your health care provider's permission. Bran foods may help with constipation problems. Drinking enough fluids to keep your urine clear or pale yellow may help as well.   °· Try to have someone home with you for 1-2 weeks to help around the house.    °· Keep all of your follow-up appointments as directed by your health care provider.   °SEEK MEDICAL CARE IF: °· You have swelling, redness, or increasing pain around your incision sites.   °· You have pus coming from your incision.   °· You notice a bad smell coming from your incision.   °· Your incision breaks open.   °· You feel dizzy or lightheaded.   °· You have pain or bleeding when you urinate.   °· You have persistent diarrhea.   °· You have persistent nausea and vomiting.   °· You have abnormal vaginal discharge.   °· You have a rash.   °· You have any type of abnormal reaction or develop an allergy to your medicine.   °· You have poor pain control with your prescribed medicine.   °SEEK IMMEDIATE MEDICAL CARE IF: °· You have chest pain or shortness of breath. °· You have severe abdominal pain that is not relieved with pain medicine. °· You have pain or swelling in your legs. °MAKE SURE YOU: °· Understand these instructions. °· Will watch your condition. °· Will get help right away if you are not doing well or get worse. °  °This information is not intended to replace advice given to you by your health care provider. Make sure you discuss any questions you have with your health care provider. °  °Document Released: 01/08/2013 Document Revised: 03/25/2013 Document Reviewed: 01/08/2013 °Elsevier Interactive Patient Education ©2016 Elsevier Inc. ° °

## 2015-02-20 NOTE — Addendum Note (Signed)
Addendum  created 02/20/15 1412 by Asher Muir, CRNA   Modules edited: Notes Section   Notes Section:  File: [id number]

## 2015-02-23 ENCOUNTER — Encounter (HOSPITAL_COMMUNITY): Payer: Self-pay | Admitting: Obstetrics & Gynecology

## 2016-11-17 ENCOUNTER — Ambulatory Visit (INDEPENDENT_AMBULATORY_CARE_PROVIDER_SITE_OTHER): Payer: BLUE CROSS/BLUE SHIELD | Admitting: Obstetrics & Gynecology

## 2016-11-17 ENCOUNTER — Encounter: Payer: Self-pay | Admitting: Obstetrics & Gynecology

## 2016-11-17 VITALS — BP 118/76 | Ht 66.0 in | Wt 309.0 lb

## 2016-11-17 DIAGNOSIS — E6609 Other obesity due to excess calories: Secondary | ICD-10-CM | POA: Diagnosis not present

## 2016-11-17 DIAGNOSIS — N644 Mastodynia: Secondary | ICD-10-CM

## 2016-11-17 DIAGNOSIS — Z1151 Encounter for screening for human papillomavirus (HPV): Secondary | ICD-10-CM

## 2016-11-17 DIAGNOSIS — IMO0001 Reserved for inherently not codable concepts without codable children: Secondary | ICD-10-CM

## 2016-11-17 DIAGNOSIS — Z6841 Body Mass Index (BMI) 40.0 and over, adult: Secondary | ICD-10-CM | POA: Diagnosis not present

## 2016-11-17 DIAGNOSIS — Z01411 Encounter for gynecological examination (general) (routine) with abnormal findings: Secondary | ICD-10-CM | POA: Diagnosis not present

## 2016-11-17 DIAGNOSIS — Z90710 Acquired absence of both cervix and uterus: Secondary | ICD-10-CM | POA: Diagnosis not present

## 2016-11-17 NOTE — Patient Instructions (Signed)
1. Encounter for gynecological examination with abnormal finding Gyn exam s/p Hysterectomy.  Pap/HPV HR done.  Rt Breast normal/Lt Breast pain.  2. History of robot-assisted laparoscopic hysterectomy Recommend preventive Kegel Exercises for Pelvic floor.  3. Pain of left breast Left Upper-Outer Quadrant pain with Fibrocystic area corresponding on exam.  Will proceed with Lt Dx Mammo/Breast US.  4. Class 3 obesity due to excess calories without serious comorbidity with body mass index (BMI) of 45.0 to 49.9 in adult [facility]) Low Calorie/Carb diet reviewed, [place] recommended.  Regular daily fast pace Walking recommended.  Tina Caldwell, it was a pleasure to see you today!  I will inform you of your results as soon as available.   Exercising to Lose Weight Exercising can help you to lose weight. In order to lose weight through exercise, you need to do vigorous-intensity exercise. You can tell that you are exercising with vigorous intensity if you are breathing very hard and fast and cannot hold a conversation while exercising. Moderate-intensity exercise helps to maintain your current weight. You can tell that you are exercising at a moderate level if you have a higher heart rate and faster breathing, but you are still able to hold a conversation. How often should I exercise? Choose an activity that you enjoy and set realistic goals. Your health care provider can help you to make an activity plan that works for you. Exercise regularly as directed by your health care provider. This may include:  Doing resistance training twice each week, such as: ? Push-ups. ? Sit-ups. ? Lifting weights. ? Using resistance bands.  Doing a given intensity of exercise for a given amount of time. Choose from these options: ? 150 minutes of moderate-intensity exercise every week. ? 75 minutes of vigorous-intensity exercise every week. ? A mix of moderate-intensity and vigorous-intensity exercise every  week.  Children, pregnant women, people who are out of shape, people who are overweight, and older adults may need to consult a health care provider for individual recommendations. If you have any sort of medical condition, be sure to consult your health care provider before starting a new exercise program. What are some activities that can help me to lose weight?  Walking at a rate of at least 4.5 miles an hour.  Jogging or running at a rate of 5 miles per hour.  Biking at a rate of at least 10 miles per hour.  Lap swimming.  Roller-skating or in-line skating.  Cross-country skiing.  Vigorous competitive sports, such as football, basketball, and soccer.  Jumping rope.  Aerobic dancing. How can I be more active in my day-to-day activities?  Use the stairs instead of the elevator.  Take a walk during your lunch break.  If you drive, park your car farther away from work or school.  If you take public transportation, get off one stop early and walk the rest of the way.  Make all of your phone calls while standing up and walking around.  Get up, stretch, and walk around every 30 minutes throughout the day. What guidelines should I follow while exercising?  Do not exercise so much that you hurt yourself, feel dizzy, or get very short of breath.  Consult your health care provider prior to starting a new exercise program.  Wear comfortable clothes and shoes with good support.  Drink plenty of water while you exercise to prevent dehydration or heat stroke. Body water is lost during exercise and must be replaced.  Work out until Secretary/administrator  breathe faster and your heart beats faster. This information is not intended to replace advice given to you by your health care provider. Make sure you discuss any questions you have with your health care provider. Document Released: 04/22/2010 Document Revised: 08/26/2015 Document Reviewed: 08/21/2013 Elsevier Interactive Patient Education  2018  [place] for [employer] Calories are units of energy. Your body needs a certain amount of calories from food to keep you going throughout the day. When you eat more calories than your body needs, your body stores the extra calories as fat. When you eat fewer calories than your body needs, your body burns fat to get the energy it needs. Calorie counting means keeping track of how many calories you eat and drink each day. Calorie counting can be helpful if you need to lose weight. If you make sure to eat fewer calories than your body needs, you should lose weight. Ask your health care provider what a healthy weight is for you. For calorie counting to work, you will need to eat the right number of calories in a day in order to lose a healthy amount of weight per week. A dietitian can help you determine how many calories you need in a day and will give you suggestions on how to reach your calorie goal.  A healthy amount of weight to lose per week is usually 1-2 lb (0.5-0.9 kg). This usually means that your daily calorie intake should be reduced by 500-750 calories.  Eating 1,200 - 1,500 calories per day can help most women lose weight.  Eating 1,500 - 1,800 calories per day can help most men lose weight.  What is my plan? My goal is to have __________ calories per day. If I have this many calories per day, I should lose around __________ pounds per week. What do I need to know about calorie counting? In order to meet your daily calorie goal, you will need to:  Find out how many calories are in each food you would like to eat. Try to do this before you eat.  Decide how much of the food you plan to eat.  Write down what you ate and how many calories it had. Doing this is called keeping a food log.  To successfully lose weight, it is important to balance calorie counting with a healthy lifestyle that includes regular activity. Aim for 150 minutes of moderate exercise (such  as walking) or 75 minutes of vigorous exercise (such as running) each week. Where do I find calorie information?  The number of calories in a food can be found on a Nutrition Facts label. If a food does not have a Nutrition Facts label, try to look up the calories online or ask your dietitian for help. Remember that calories are listed per serving. If you choose to have more than one serving of a food, you will have to multiply the calories per serving by the amount of servings you plan to eat. For example, the label on a package of bread might say that a serving size is 1 slice and that there are 90 calories in a serving. If you eat 1 slice, you will have eaten 90 calories. If you eat 2 slices, you will have eaten 180 calories. How do I keep a food log? Immediately after each meal, record the following information in your food log:  What you ate. Don't forget to include toppings, sauces, and other extras on the food.  How much  you ate. This can be measured in cups, ounces, or number of items.  How many calories each food and drink had.  The total number of calories in the meal.  Keep your food log near you, such as in a small notebook in your pocket, or use a mobile app or website. Some programs will calculate calories for you and show you how many calories you have left for the day to meet your goal. What are some calorie counting tips?  Use your calories on foods and drinks that will fill you up and not leave you hungry: ? Some examples of foods that fill you up are nuts and nut butters, vegetables, lean proteins, and high-fiber foods like whole grains. High-fiber foods are foods with more than 5 g fiber per serving. ? Drinks such as sodas, specialty coffee drinks, alcohol, and juices have a lot of calories, yet do not fill you up.  Eat nutritious foods and avoid empty calories. Empty calories are calories you get from foods or beverages that do not have many vitamins or protein, such as  candy, sweets, and soda. It is better to have a nutritious high-calorie food (such as an avocado) than a food with few nutrients (such as a bag of chips).  Know how many calories are in the foods you eat most often. This will help you calculate calorie counts faster.  Pay attention to calories in drinks. Low-calorie drinks include water and unsweetened drinks.  Pay attention to nutrition labels for "low fat" or "fat free" foods. These foods sometimes have the same amount of calories or more calories than the full fat versions. They also often have added sugar, starch, or salt, to make up for flavor that was removed with the fat.  Find a way of tracking calories that works for you. Get creative. Try different apps or programs if writing down calories does not work for you. What are some portion control tips?  Know how many calories are in a serving. This will help you know how many servings of a certain food you can have.  Use a measuring cup to measure serving sizes. You could also try weighing out portions on a kitchen scale. With time, you will be able to estimate serving sizes for some foods.  Take some time to put servings of different foods on your favorite plates, bowls, and cups so you know what a serving looks like.  Try not to eat straight from a bag or box. Doing this can lead to overeating. Put the amount you would like to eat in a cup or on a plate to make sure you are eating the right portion.  Use smaller plates, glasses, and bowls to prevent overeating.  Try not to multitask (for example, watch TV or use your computer) while eating. If it is time to eat, sit down at a table and enjoy your food. This will help you to know when you are full. It will also help you to be aware of what you are eating and how much you are eating. What are tips for following this plan? Reading food labels  Check the calorie count compared to the serving size. The serving size may be smaller than what  you are used to eating.  Check the source of the calories. Make sure the food you are eating is high in vitamins and protein and low in saturated and trans fats. Shopping  Read nutrition labels while you shop. This will help you make  healthy decisions before you decide to purchase your food.  Make a grocery list and stick to it. Cooking  Try to cook your favorite foods in a healthier way. For example, try baking instead of frying.  Use low-fat dairy products. Meal planning  Use more fruits and vegetables. Half of your plate should be fruits and vegetables.  Include lean proteins like poultry and fish. How do I count calories when eating out?  Ask for smaller portion sizes.  Consider sharing an entree and sides instead of getting your own entree.  If you get your own entree, eat only half. Ask for a box at the beginning of your meal and put the rest of your entree in it so you are not tempted to eat it.  If calories are listed on the menu, choose the lower calorie options.  Choose dishes that include vegetables, fruits, whole grains, low-fat dairy products, and lean protein.  Choose items that are boiled, broiled, grilled, or steamed. Stay away from items that are buttered, battered, fried, or served with cream sauce. Items labeled "crispy" are usually fried, unless stated otherwise.  Choose water, low-fat milk, unsweetened iced tea, or other drinks without added sugar. If you want an alcoholic beverage, choose a lower calorie option such as a glass of wine or light beer.  Ask for dressings, sauces, and syrups on the side. These are usually high in calories, so you should limit the amount you eat.  If you want a salad, choose a garden salad and ask for grilled meats. Avoid extra toppings like bacon, cheese, or fried items. Ask for the dressing on the side, or ask for olive oil and vinegar or lemon to use as dressing.  Estimate how many servings of a food you are given. For  example, a serving of cooked rice is  cup or about the size of half a baseball. Knowing serving sizes will help you be aware of how much food you are eating at restaurants. The list below tells you how big or small some common portion sizes are based on everyday objects: ? 1 oz-4 stacked dice. ? 3 oz-1 deck of cards. ? 1 tsp-1 die. ? 1 Tbsp- a ping-pong ball. ? 2 Tbsp-1 ping-pong ball. ?  cup- baseball. ? 1 cup-1 baseball. Summary  Calorie counting means keeping track of how many calories you eat and drink each day. If you eat fewer calories than your body needs, you should lose weight.  A healthy amount of weight to lose per week is usually 1-2 lb (0.5-0.9 kg). This usually means reducing your daily calorie intake by 500-750 calories.  The number of calories in a food can be found on a Nutrition Facts label. If a food does not have a Nutrition Facts label, try to look up the calories online or ask your dietitian for help.  Use your calories on foods and drinks that will fill you up, and not on foods and drinks that will leave you hungry.  Use smaller plates, glasses, and bowls to prevent overeating. This information is not intended to replace advice given to you by your health care provider. Make sure you discuss any questions you have with your health care provider. Document Released: 03/20/2005 Document Revised: 02/18/2016 Document Reviewed: 02/18/2016 Elsevier Interactive Patient Education  2017 [place].  Kegel Exercises Kegel exercises help strengthen the muscles that support the rectum, vagina, small intestine, bladder, and uterus. Doing Kegel exercises can help:  Improve bladder and bowel control.  Improve sexual response.  Reduce problems and discomfort during pregnancy.  Kegel exercises involve squeezing your pelvic floor muscles, which are the same muscles you squeeze when you try to stop the flow of urine. The exercises can be done while sitting, standing, or  lying down, but it is best to vary your position. Phase 1 exercises 1. Squeeze your pelvic floor muscles tight. You should feel a tight lift in your rectal area. If you are a female, you should also feel a tightness in your vaginal area. Keep your stomach, buttocks, and legs relaxed. 2. Hold the muscles tight for up to 10 seconds. 3. Relax your muscles. Repeat this exercise 50 times a day or as many times as told by your health care provider. Continue to do this exercise for at least 4-6 weeks or for as long as told by your health care provider. This information is not intended to replace advice given to you by your health care provider. Make sure you discuss any questions you have with your health care provider. Document Released: 03/06/2012 Document Revised: 11/13/2015 Document Reviewed: 02/07/2015 Elsevier Interactive Patient Education  [employer].

## 2016-11-17 NOTE — Progress Notes (Signed)
Tina Caldwell 04/17/1970 [id number]   History:    46 y.o. G4P3A1 married  RP:  Established patient presenting for annual gyn exam   HPI:  S/P Robotic [place] 2016 with preservation of Ovaries.  No Hot flashes/night sweats.  No pelvic pain.  C/O intermittent Left breast pains x a few weeks in upper outer quadrant, cyclic?  No lump felt.  No nipple d/c.  No skin change.  Mictions normal/BMs normal.  BMI 49.9.  Just started on a low carb diet, lost 7 Lbs in last 2 weeks.  Walking at work.  Husband with HPV related Throat Cancer.  Past medical history,surgical history, family history and social history were all reviewed and documented in the EPIC chart.  Gynecologic History Patient's last menstrual period was 02/10/2015 (exact date). Contraception: status post hysterectomy Last Pap: Unsure, will obtain medical records.  Last mammogram: 03/2016. Results were: normal  Obstetric History OB History  Gravida Para Term Preterm AB Living  4 3     1 2   SAB TAB Ectopic Multiple Live Births  1            # Outcome Date GA Lbr Len/2nd Weight Sex Delivery Anes PTL Lv  4 SAB           3 Para           2 Para           1 Para                ROS: A ROS was performed and pertinent positives and negatives are included in the history.  GENERAL: No fevers or chills. HEENT: No change in vision, no earache, sore throat or sinus congestion. NECK: No pain or stiffness. CARDIOVASCULAR: No chest pain or pressure. No palpitations. PULMONARY: No shortness of breath, cough or wheeze. GASTROINTESTINAL: No abdominal pain, nausea, vomiting or diarrhea, melena or bright red blood per rectum. GENITOURINARY: No urinary frequency, urgency, hesitancy or dysuria. MUSCULOSKELETAL: No joint or muscle pain, no back pain, no recent trauma. DERMATOLOGIC: No rash, no itching, no lesions. ENDOCRINE: No polyuria, polydipsia, no heat or cold intolerance. No recent change in weight. HEMATOLOGICAL: No anemia or easy bruising or  bleeding. NEUROLOGIC: No headache, seizures, numbness, tingling or weakness. PSYCHIATRIC: No depression, no loss of interest in normal activity or change in sleep pattern.     Exam:   BP 118/76   Ht 5\' 6"  (1.676 m)   Wt (!) 309 lb (140.2 kg)   LMP 02/10/2015 (Exact Date)   BMI 49.87 kg/m   Body mass index is 49.87 kg/m.  General appearance : Well developed well nourished female. No acute distress HEENT: Eyes: no retinal hemorrhage or exudates,  Neck supple, trachea midline, no carotid bruits, no thyroidmegaly Lungs: Clear to auscultation, no rhonchi or wheezes, or rib retractions  Heart: Regular rate and rhythm, no murmurs or gallops Breast:Examined in sitting and supine position were symmetrical in appearance, no palpable masses or tenderness Rt Breast,  no skin retraction, no nipple inversion, no nipple discharge, no skin discoloration, no axillary or supraclavicular lymphadenopathy bilaterally.  Left Breast tender at Upper-Outer Quadrant with Fibrocystic tissue felt in that area. Abdomen: no palpable masses or tenderness, no rebound or guarding Extremities: no edema or skin discoloration or tenderness  Pelvic: Vulva normal  B[place], Urethra, Skene Glands: Within normal limits             Vagina: No gross lesions or discharge.  Pap/HPV HR done.  Cervix/Uterus absent   Adnexa  Without masses or tenderness  Anus and perineum  normal    Assessment/Plan:  46 y.o. female for annual exam   1. Encounter for gynecological examination with abnormal finding Gyn exam s/p Hysterectomy.  Pap/HPV HR done.  Rt Breast normal/Lt Breast pain.  2. History of robot-assisted laparoscopic hysterectomy Recommend preventive Kegel Exercises for Pelvic floor.  3. Pain of left breast Left Upper-Outer Quadrant pain with Fibrocystic area corresponding on exam.  Will proceed with Lt Dx Mammo/Breast US.  4. Class 3 obesity due to excess calories without serious comorbidity with body mass index (BMI)  of 45.0 to 49.9 in adult [facility]) Low Calorie/Carb diet reviewed, [place] recommended.  Regular daily fast pace Walking recommended.  Counseling on above issues >50% x 10 minutes.  Princess Bruins MD, 8:37 AM 11/17/2016

## 2016-11-17 NOTE — Addendum Note (Signed)
Addended by: Thurnell Garbe A on: 11/17/2016 11:53 AM   Modules accepted: Orders

## 2016-11-21 LAB — PAP, TP IMAGING W/ HPV RNA, RFLX HPV TYPE 16,18/45: HPV mRNA, High Risk: NOT DETECTED

## 2017-08-13 ENCOUNTER — Other Ambulatory Visit: Payer: Self-pay | Admitting: Family Medicine

## 2017-08-13 DIAGNOSIS — H543 Unqualified visual loss, both eyes: Secondary | ICD-10-CM

## 2017-08-13 DIAGNOSIS — R42 Dizziness and giddiness: Secondary | ICD-10-CM

## 2017-08-23 ENCOUNTER — Inpatient Hospital Stay: Admission: RE | Admit: 2017-08-23 | Payer: Managed Care, Other (non HMO) | Source: Ambulatory Visit

## 2017-10-05 ENCOUNTER — Other Ambulatory Visit: Payer: Self-pay | Admitting: Obstetrics & Gynecology

## 2017-10-05 DIAGNOSIS — Z1231 Encounter for screening mammogram for malignant neoplasm of breast: Secondary | ICD-10-CM

## 2017-11-12 ENCOUNTER — Ambulatory Visit
Admission: RE | Admit: 2017-11-12 | Discharge: 2017-11-12 | Disposition: A | Discharge status: Home / Self Care | Payer: BLUE CROSS/BLUE SHIELD | Source: Ambulatory Visit | Attending: Obstetrics & Gynecology | Admitting: Obstetrics & Gynecology

## 2017-11-12 DIAGNOSIS — Z1231 Encounter for screening mammogram for malignant neoplasm of breast: Secondary | ICD-10-CM

## 2017-12-18 ENCOUNTER — Other Ambulatory Visit: Payer: Self-pay | Admitting: Orthopedic Surgery

## 2017-12-18 DIAGNOSIS — M7122 Synovial cyst of popliteal space [Baker], left knee: Secondary | ICD-10-CM

## 2017-12-31 ENCOUNTER — Ambulatory Visit
Admission: RE | Admit: 2017-12-31 | Discharge: 2017-12-31 | Disposition: A | Discharge status: Home / Self Care | Payer: BLUE CROSS/BLUE SHIELD | Source: Ambulatory Visit | Attending: Orthopedic Surgery | Admitting: Orthopedic Surgery

## 2017-12-31 DIAGNOSIS — M7122 Synovial cyst of popliteal space [Baker], left knee: Secondary | ICD-10-CM

## 2018-03-15 ENCOUNTER — Other Ambulatory Visit: Payer: Self-pay | Admitting: Family Medicine

## 2018-03-15 DIAGNOSIS — H543 Unqualified visual loss, both eyes: Secondary | ICD-10-CM

## 2018-03-15 DIAGNOSIS — R42 Dizziness and giddiness: Secondary | ICD-10-CM

## 2018-03-24 ENCOUNTER — Other Ambulatory Visit: Payer: BLUE CROSS/BLUE SHIELD

## 2018-09-23 ENCOUNTER — Other Ambulatory Visit: Payer: Self-pay | Admitting: Obstetrics & Gynecology

## 2018-09-23 DIAGNOSIS — Z1231 Encounter for screening mammogram for malignant neoplasm of breast: Secondary | ICD-10-CM

## 2018-10-24 ENCOUNTER — Other Ambulatory Visit: Payer: Self-pay

## 2018-10-25 ENCOUNTER — Encounter: Payer: Self-pay | Admitting: Obstetrics & Gynecology

## 2018-10-25 ENCOUNTER — Ambulatory Visit (INDEPENDENT_AMBULATORY_CARE_PROVIDER_SITE_OTHER): Payer: BC Managed Care – PPO | Admitting: Obstetrics & Gynecology

## 2018-10-25 VITALS — BP 128/86 | Ht 66.0 in | Wt 299.0 lb

## 2018-10-25 DIAGNOSIS — Z90710 Acquired absence of both cervix and uterus: Secondary | ICD-10-CM

## 2018-10-25 DIAGNOSIS — Z6841 Body Mass Index (BMI) 40.0 and over, adult: Secondary | ICD-10-CM | POA: Diagnosis not present

## 2018-10-25 DIAGNOSIS — Z01419 Encounter for gynecological examination (general) (routine) without abnormal findings: Secondary | ICD-10-CM | POA: Diagnosis not present

## 2018-10-25 DIAGNOSIS — Z1272 Encounter for screening for malignant neoplasm of vagina: Secondary | ICD-10-CM

## 2018-10-25 NOTE — Progress Notes (Signed)
Tina Caldwell July 16, 1970 [id number]   History:    48 y.o. [id number]  Married.  Mortgage at [employer]  RP:  Established patient presenting for annual gyn exam    HPI: S/P Robotic TLH with preservation of ovaries in 2016.  No pelvic pain.  No pain with IC.  Urine/BMs normal.  Breasts normal.  BMI 48.26.  Lost 15 Lbs on Keto diet x 1 month.  Doing intermittent fasting.  [place] with [facility] 7 yrs ago.  Past medical history,surgical history, family history and social history were all reviewed and documented in the EPIC chart.  Gynecologic History Patient's last menstrual period was 02/10/2015 (exact date). Contraception: status post hysterectomy Last Pap: 11/2016.  Results were: Negative Last mammogram: 11/2017. Results were: Negative Bone Density: Never Colonoscopy: 7 yrs ago normal.  Obstetric History OB History  Gravida Para Term Preterm AB Living  4 3     1 2   SAB TAB Ectopic Multiple Live Births  1            # Outcome Date GA Lbr Len/2nd Weight Sex Delivery Anes PTL Lv  4 SAB           3 Para           2 Para           1 Para              ROS: A ROS was performed and pertinent positives and negatives are included in the history.  GENERAL: No fevers or chills. HEENT: No change in vision, no earache, sore throat or sinus congestion. NECK: No pain or stiffness. CARDIOVASCULAR: No chest pain or pressure. No palpitations. PULMONARY: No shortness of breath, cough or wheeze. GASTROINTESTINAL: No abdominal pain, nausea, vomiting or diarrhea, melena or bright red blood per rectum. GENITOURINARY: No urinary frequency, urgency, hesitancy or dysuria. MUSCULOSKELETAL: No joint or muscle pain, no back pain, no recent trauma. DERMATOLOGIC: No rash, no itching, no lesions. ENDOCRINE: No polyuria, polydipsia, no heat or cold intolerance. No recent change in weight. HEMATOLOGICAL: No anemia or easy bruising or bleeding. NEUROLOGIC: No headache, seizures, numbness, tingling or  weakness. PSYCHIATRIC: No depression, no loss of interest in normal activity or change in sleep pattern.     Exam:   BP 128/86    Ht 5\' 6"  (1.676 m)    Wt 299 lb (135.6 kg)    LMP 02/10/2015 (Exact Date)    BMI 48.26 kg/m   Body mass index is 48.26 kg/m.  General appearance : Well developed well nourished female. No acute distress HEENT: Eyes: no retinal hemorrhage or exudates,  Neck supple, trachea midline, no carotid bruits, no thyroidmegaly Lungs: Clear to auscultation, no rhonchi or wheezes, or rib retractions  Heart: Regular rate and rhythm, no murmurs or gallops Breast:Examined in sitting and supine position were symmetrical in appearance, no palpable masses or tenderness,  no skin retraction, no nipple inversion, no nipple discharge, no skin discoloration, no axillary or supraclavicular lymphadenopathy Abdomen: no palpable masses or tenderness, no rebound or guarding Extremities: no edema or skin discoloration or tenderness  Pelvic: Vulva: Normal             Vagina: No gross lesions or discharge.  Pap reflex done.  Cervix/Uterus absent  Adnexa  Without masses or tenderness  Anus: Normal   Assessment/Plan:  48 y.o. female for annual exam   1. Encounter for Papanicolaou smear of vagina as part of routine  gynecological examination Gynecologic exam status post total hysterectomy.  Pap reflex done on the vaginal vault.  Breast exam normal.  Last screening mammogram August 2019 was negative.  Colonoscopy done 7 years ago was normal.  Health labs with family physician.  2. S/P total hysterectomy  3. Class 3 severe obesity due to excess calories without serious comorbidity with body mass index (BMI) of 45.0 to 49.9 in adult [facility]) Low calorie/carb diet such as [place] recommended.  Patient will continue with intermittent fasting.  Aerobic physical activities 5 times a week and weightlifting every 2 days.  Princess Bruins MD, 12:02 PM 10/25/2018

## 2018-10-28 ENCOUNTER — Encounter: Payer: Self-pay | Admitting: Obstetrics & Gynecology

## 2018-10-28 LAB — PAP IG W/ RFLX HPV ASCU

## 2018-10-28 NOTE — Patient Instructions (Addendum)
1. Encounter for Papanicolaou smear of vagina as part of routine gynecological examination Gynecologic exam status post total hysterectomy.  Pap reflex done on the vaginal vault.  Breast exam normal.  Last screening mammogram August 2019 was negative.  Colonoscopy done 7 years ago was normal.  Health labs with family physician.  2. S/P total hysterectomy  3. Class 3 severe obesity due to excess calories without serious comorbidity with body mass index (BMI) of 45.0 to 49.9 in adult [facility]) Low calorie/carb diet such as [place] recommended.  Patient will continue with intermittent fasting.  Aerobic physical activities 5 times a week and weightlifting every 2 days.  Tina Caldwell, it was a pleasure seeing you today!  I will inform you of your results as soon as they are available.

## 2018-11-15 ENCOUNTER — Other Ambulatory Visit: Payer: Self-pay

## 2018-11-15 ENCOUNTER — Ambulatory Visit
Admission: RE | Admit: 2018-11-15 | Discharge: 2018-11-15 | Disposition: A | Discharge status: Home / Self Care | Payer: BC Managed Care – PPO | Source: Ambulatory Visit | Attending: Obstetrics & Gynecology | Admitting: Obstetrics & Gynecology

## 2018-11-15 DIAGNOSIS — Z1231 Encounter for screening mammogram for malignant neoplasm of breast: Secondary | ICD-10-CM

## 2019-02-26 ENCOUNTER — Other Ambulatory Visit: Payer: Self-pay

## 2019-02-26 DIAGNOSIS — Z20822 Contact with and (suspected) exposure to covid-19: Secondary | ICD-10-CM

## 2019-02-28 LAB — NOVEL CORONAVIRUS, NAA: SARS-CoV-2, NAA: DETECTED — AB

## 2019-03-01 ENCOUNTER — Telehealth: Payer: Self-pay | Admitting: Unknown Physician Specialty

## 2019-03-01 ENCOUNTER — Telehealth: Payer: Self-pay | Admitting: Critical Care Medicine

## 2019-03-01 NOTE — Telephone Encounter (Signed)
Left message for patient about monoclonal antibody treatment for Covid 19

## 2019-03-01 NOTE — Telephone Encounter (Signed)
I spoke to this patient and she is a good candidate for the treatment as she has mild symptoms and is morbidly obese.  She is giving [place] consideration and will call me back with regards to the monoclonal antibody treatment

## 2019-03-02 ENCOUNTER — Telehealth: Payer: Self-pay | Admitting: Nurse Practitioner

## 2019-03-02 NOTE — Telephone Encounter (Signed)
Followed up on monoclonal antibody conversations that patient had yesterday with Dr. Joya Gaskins and Kathrine Haddock NP.  At this time patient is not interested in receiving infusion for Covid.  She stated appreciation for time and is aware if any worsening symptoms, such as SOB or difficulty breathing, to seek immediate care.

## 2019-03-05 ENCOUNTER — Other Ambulatory Visit: Payer: Self-pay

## 2019-03-05 DIAGNOSIS — Z20822 Contact with and (suspected) exposure to covid-19: Secondary | ICD-10-CM

## 2019-03-07 LAB — NOVEL CORONAVIRUS, NAA: SARS-CoV-2, NAA: DETECTED — AB

## 2019-07-03 ENCOUNTER — Other Ambulatory Visit (INDEPENDENT_AMBULATORY_CARE_PROVIDER_SITE_OTHER): Payer: BC Managed Care – PPO

## 2019-07-03 ENCOUNTER — Ambulatory Visit: Payer: BC Managed Care – PPO | Admitting: Obstetrics & Gynecology

## 2019-07-03 ENCOUNTER — Encounter: Payer: Self-pay | Admitting: Obstetrics & Gynecology

## 2019-07-03 ENCOUNTER — Other Ambulatory Visit: Payer: Self-pay

## 2019-07-03 VITALS — BP 128/82

## 2019-07-03 DIAGNOSIS — R1032 Left lower quadrant pain: Secondary | ICD-10-CM

## 2019-07-03 DIAGNOSIS — Z90710 Acquired absence of both cervix and uterus: Secondary | ICD-10-CM | POA: Diagnosis not present

## 2019-07-03 LAB — CBC WITH DIFFERENTIAL/PLATELET
Absolute Monocytes: 421 cells/uL (ref 200–950)
Basophils Absolute: 43 cells/uL (ref 0–200)
Basophils Relative: 0.5 %
Eosinophils Absolute: 120 cells/uL (ref 15–500)
Eosinophils Relative: 1.4 %
HCT: 43 % (ref 35.0–45.0)
Hemoglobin: 14.6 g/dL (ref 11.7–15.5)
Lymphs Abs: 1969 cells/uL (ref 850–3900)
MCH: 29.4 pg (ref 27.0–33.0)
MCHC: 34 g/dL (ref 32.0–36.0)
MCV: 86.7 fL (ref 80.0–100.0)
MPV: 9.5 fL (ref 7.5–12.5)
Monocytes Relative: 4.9 %
Neutro Abs: 6046 cells/uL (ref 1500–7800)
Neutrophils Relative %: 70.3 %
Platelets: 282 10*3/uL (ref 140–400)
RBC: 4.96 10*6/uL (ref 3.80–5.10)
RDW: 12.8 % (ref 11.0–15.0)
Total Lymphocyte: 22.9 %
WBC: 8.6 10*3/uL (ref 3.8–10.8)

## 2019-07-03 NOTE — Progress Notes (Signed)
    Tina Caldwell 03/28/1971 [id number]        49 y.o.  [id number]  Married  RP: LLQ pain x 3 days  HPI: S/P Total Hysterectomy.  Sudden left lower quadrant pain as she was sitting in bed, getting to a laying down position 2 days ago.  Pain is worsened by movement, especially sitting up or laying down.  The pain is coming and going, positional.  No lump or bulging felt at the site of pain.  No UTI Sx.  BMs normal.  No vomitting. No fever.   OB History  Gravida Para Term Preterm AB Living  4 3     1 2   SAB TAB Ectopic Multiple Live Births  1            # Outcome Date GA Lbr Len/2nd Weight Sex Delivery Anes PTL Lv  4 SAB           3 Para           2 Para           1 Para             Past medical history,surgical history, problem list, medications, allergies, family history and social history were all reviewed and documented in the EPIC chart.   Directed ROS with pertinent positives and negatives documented in the history of present illness/assessment and plan.  Exam:  Vitals:   07/03/19 1434  BP: 128/82   General appearance:  Normal  Abdomen: Tender at the left lower abdomen.  Small weakness in the abdominal wall?  No bulging mass with increased abdominal pressure.  Gynecologic exam: Vulva normal.  S/P Total Hysterectomy.  No pelvic cyst/mass, NT.  Pelvic US today: T/V images.  Uterus is surgically absent.  Vaginal cuff normal.  Both ovaries are identified, normal in size with sparse follicles and normal perfusion.  No adnexal mass or free fluid seen abdominally or vaginally.   Assessment/Plan:  49 y.o. [id number]   1. Left lower quadrant pain No evidence of Left ovarian cyst or torsion.  Pelvic ultrasound findings thoroughly reviewed and patient reassured.  Possible LLQ abdominal wall pain d/t inflammation or small hernia.  No bulging, no incarceration.  No intestinal symptoms.  Recommend rest and ibuprofen regularly for the next 2 days.  If patient has a small hernia,  recommend urgent consultation if feeling a bulge that is not retractable which could be incarceration of bowels through her hernia.  Will call back for referral to general surgery if no improvement or worsening left lower abdominal wall pain. - CBC w/Diff - US Transvaginal Non-OB  2. S/P total hysterectomy  Princess Bruins MD, 2:45 PM 07/03/2019

## 2019-07-04 ENCOUNTER — Encounter: Payer: Self-pay | Admitting: Obstetrics & Gynecology

## 2019-07-04 NOTE — Patient Instructions (Signed)
1. Left lower quadrant pain No evidence of Left ovarian cyst or torsion.  Pelvic ultrasound findings thoroughly reviewed and patient reassured.  Possible LLQ abdominal wall pain d/t inflammation or small hernia.  No bulging, no incarceration.  No intestinal symptoms.  Recommend rest and ibuprofen regularly for the next 2 days.  If patient has a small hernia, recommend urgent consultation if feeling a bulge that is not retractable which could be incarceration of bowels through her hernia.  Will call back for referral to general surgery if no improvement or worsening left lower abdominal wall pain. - CBC w/Diff - US Transvaginal Non-OB  2. S/P total hysterectomy  Tina Caldwell, it was a pleasure seeing you today!  I will inform you of your results as soon as they are available.

## 2020-02-23 ENCOUNTER — Other Ambulatory Visit: Payer: Self-pay | Admitting: Obstetrics & Gynecology

## 2020-02-23 DIAGNOSIS — Z1231 Encounter for screening mammogram for malignant neoplasm of breast: Secondary | ICD-10-CM

## 2020-03-04 ENCOUNTER — Ambulatory Visit
Admission: RE | Admit: 2020-03-04 | Discharge: 2020-03-04 | Disposition: A | Discharge status: Home / Self Care | Payer: BC Managed Care – PPO | Source: Ambulatory Visit | Attending: Obstetrics & Gynecology | Admitting: Obstetrics & Gynecology

## 2020-03-04 ENCOUNTER — Other Ambulatory Visit: Payer: Self-pay

## 2020-03-04 DIAGNOSIS — Z1231 Encounter for screening mammogram for malignant neoplasm of breast: Secondary | ICD-10-CM

## 2020-04-09 ENCOUNTER — Ambulatory Visit: Payer: BC Managed Care – PPO

## 2020-05-27 ENCOUNTER — Other Ambulatory Visit: Payer: Self-pay | Admitting: Gastroenterology

## 2020-05-27 ENCOUNTER — Other Ambulatory Visit (HOSPITAL_COMMUNITY): Payer: Self-pay | Admitting: Gastroenterology

## 2020-05-27 DIAGNOSIS — R1011 Right upper quadrant pain: Secondary | ICD-10-CM

## 2020-06-11 ENCOUNTER — Ambulatory Visit (HOSPITAL_COMMUNITY)
Admission: RE | Admit: 2020-06-11 | Discharge: 2020-06-11 | Disposition: A | Discharge status: Home / Self Care | Payer: BC Managed Care – PPO | Source: Ambulatory Visit | Attending: Gastroenterology | Admitting: Gastroenterology

## 2020-06-11 ENCOUNTER — Encounter (HOSPITAL_COMMUNITY): Payer: Self-pay

## 2020-06-11 ENCOUNTER — Other Ambulatory Visit: Payer: Self-pay

## 2020-06-11 DIAGNOSIS — R1011 Right upper quadrant pain: Secondary | ICD-10-CM

## 2020-06-14 ENCOUNTER — Other Ambulatory Visit: Payer: Self-pay | Admitting: Gastroenterology

## 2020-06-14 DIAGNOSIS — R1033 Periumbilical pain: Secondary | ICD-10-CM

## 2020-06-23 ENCOUNTER — Ambulatory Visit
Admission: RE | Admit: 2020-06-23 | Discharge: 2020-06-23 | Disposition: A | Discharge status: Home / Self Care | Payer: BC Managed Care – PPO | Source: Ambulatory Visit | Attending: Gastroenterology | Admitting: Gastroenterology

## 2020-06-23 DIAGNOSIS — R1033 Periumbilical pain: Secondary | ICD-10-CM

## 2020-06-23 MED ORDER — IOPAMIDOL (ISOVUE-300) INJECTION 61%
100.0000 mL | Freq: Once | INTRAVENOUS | Status: AC | PRN
  Administered 2020-06-23: 100 mL via INTRAVENOUS

## 2021-01-05 ENCOUNTER — Other Ambulatory Visit: Payer: Self-pay | Admitting: Family Medicine

## 2021-01-05 DIAGNOSIS — Z1231 Encounter for screening mammogram for malignant neoplasm of breast: Secondary | ICD-10-CM

## 2021-03-07 ENCOUNTER — Ambulatory Visit: Payer: BC Managed Care – PPO

## 2021-06-24 ENCOUNTER — Ambulatory Visit: Payer: BC Managed Care – PPO

## 2021-06-27 ENCOUNTER — Ambulatory Visit
Admission: RE | Admit: 2021-06-27 | Discharge: 2021-06-27 | Disposition: A | Discharge status: Home / Self Care | Payer: Managed Care, Other (non HMO) | Source: Ambulatory Visit | Attending: Family Medicine | Admitting: Family Medicine

## 2021-06-27 DIAGNOSIS — Z1231 Encounter for screening mammogram for malignant neoplasm of breast: Secondary | ICD-10-CM

## 2022-12-21 ENCOUNTER — Other Ambulatory Visit: Payer: Self-pay | Admitting: Family Medicine

## 2022-12-21 DIAGNOSIS — Z1231 Encounter for screening mammogram for malignant neoplasm of breast: Secondary | ICD-10-CM

## 2023-01-16 ENCOUNTER — Ambulatory Visit: Payer: Managed Care, Other (non HMO)

## 2023-01-25 ENCOUNTER — Ambulatory Visit
Admission: RE | Admit: 2023-01-25 | Discharge: 2023-01-25 | Disposition: A | Discharge status: Home / Self Care | Payer: Managed Care, Other (non HMO) | Source: Ambulatory Visit | Attending: Family Medicine | Admitting: Family Medicine

## 2023-01-25 DIAGNOSIS — Z1231 Encounter for screening mammogram for malignant neoplasm of breast: Secondary | ICD-10-CM

## 2024-02-25 IMAGING — MG MM DIGITAL SCREENING BILAT W/ TOMO AND CAD
6 of 10 series · 6 of 30 positions shown · non-contrast
Comparison: Previous exam(s).

CLINICAL DATA: Screening.

EXAM:
DIGITAL SCREENING BILATERAL MAMMOGRAM WITH TOMOSYNTHESIS AND CAD
TECHNIQUE: Bilateral screening digital craniocaudal and mediolateral oblique
mammograms were obtained. Bilateral screening digital breast
tomosynthesis was performed. The images were evaluated with
computer-aided detection.

[L MLO synth-2D (1 of 2)]
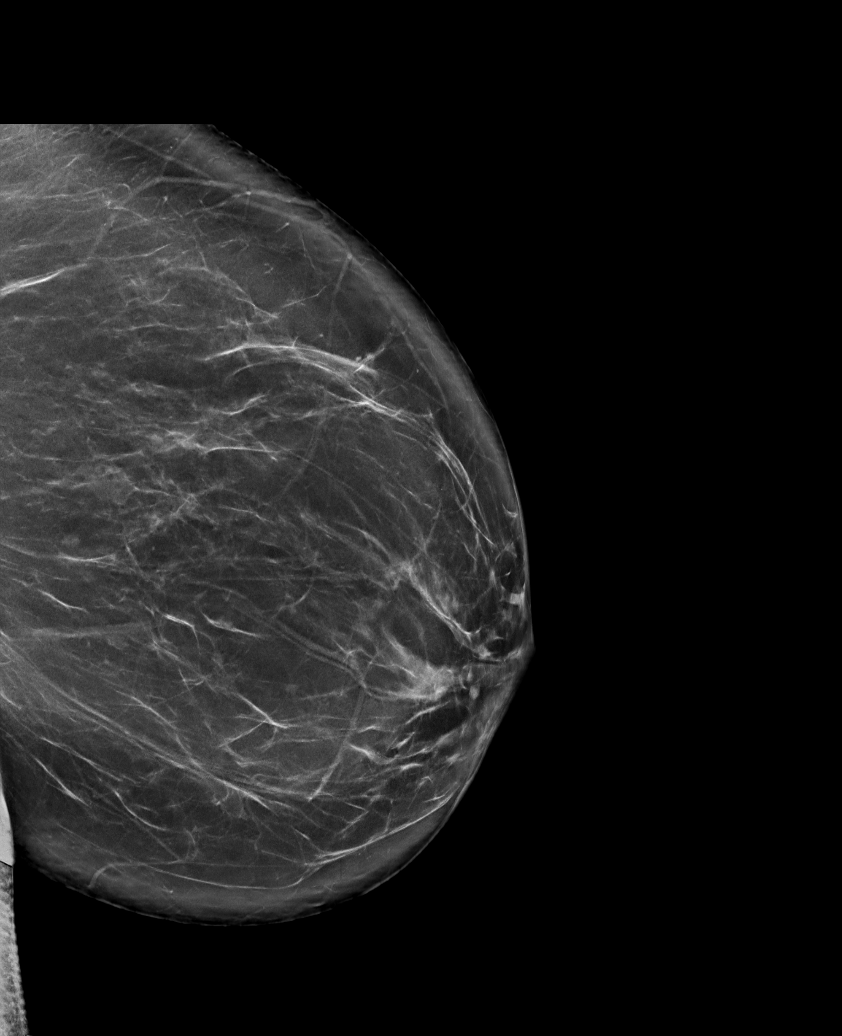

[L CC synth-2D]
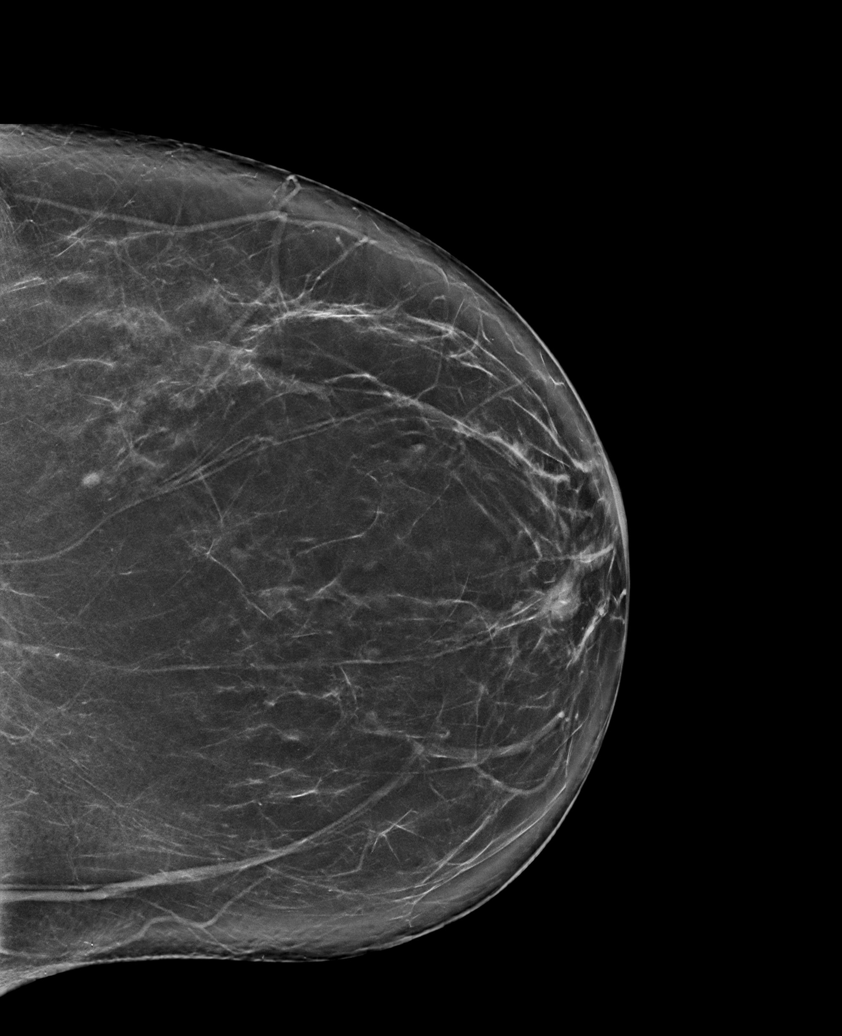

[L MLO synth-2D (2 of 2)]
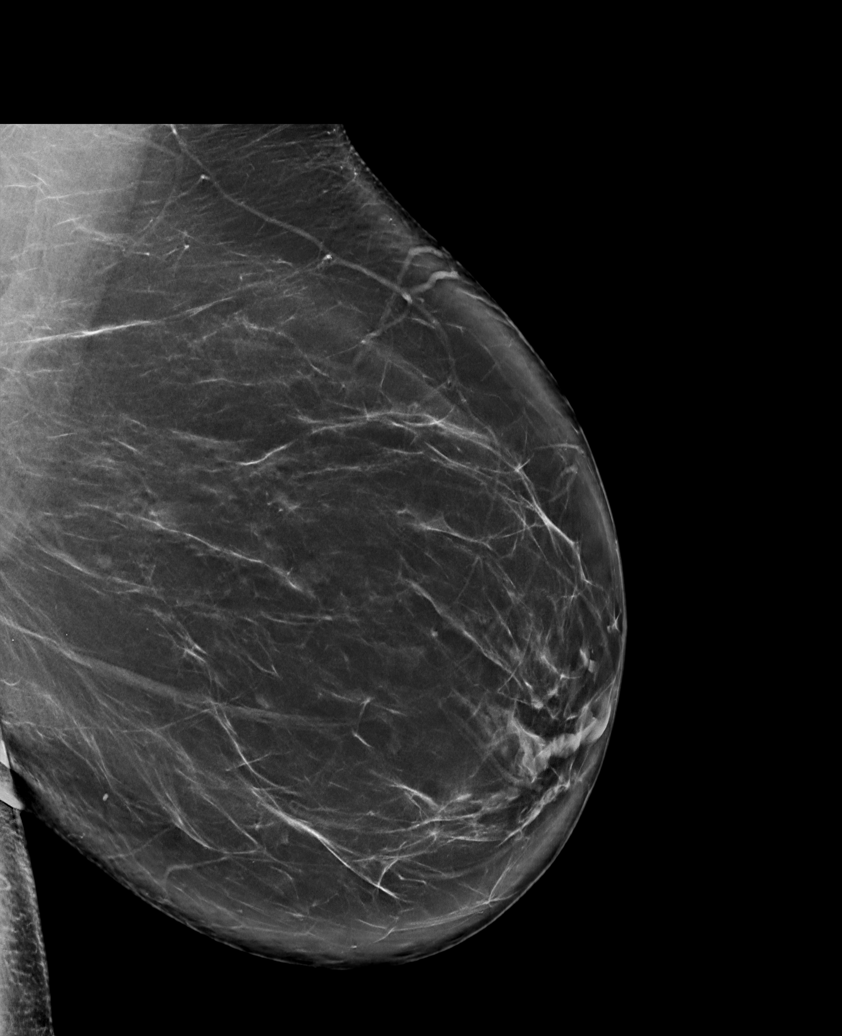

[R MLO synth-2D]
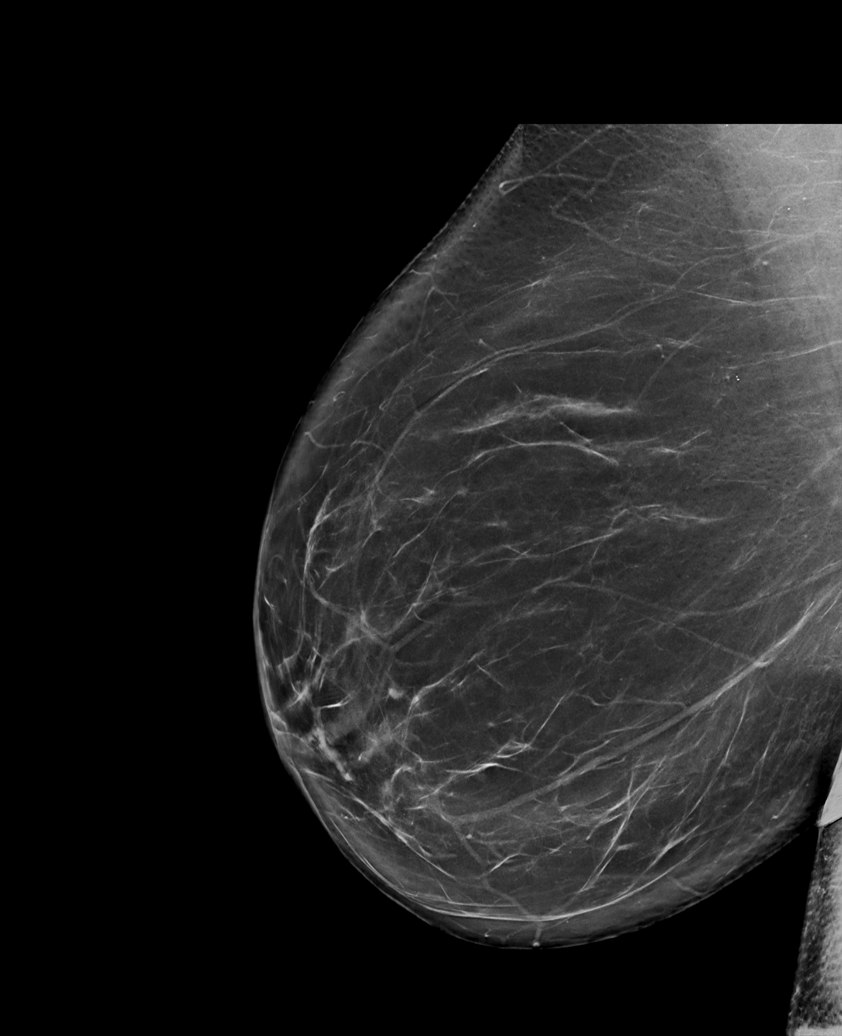

[R CC synth-2D]
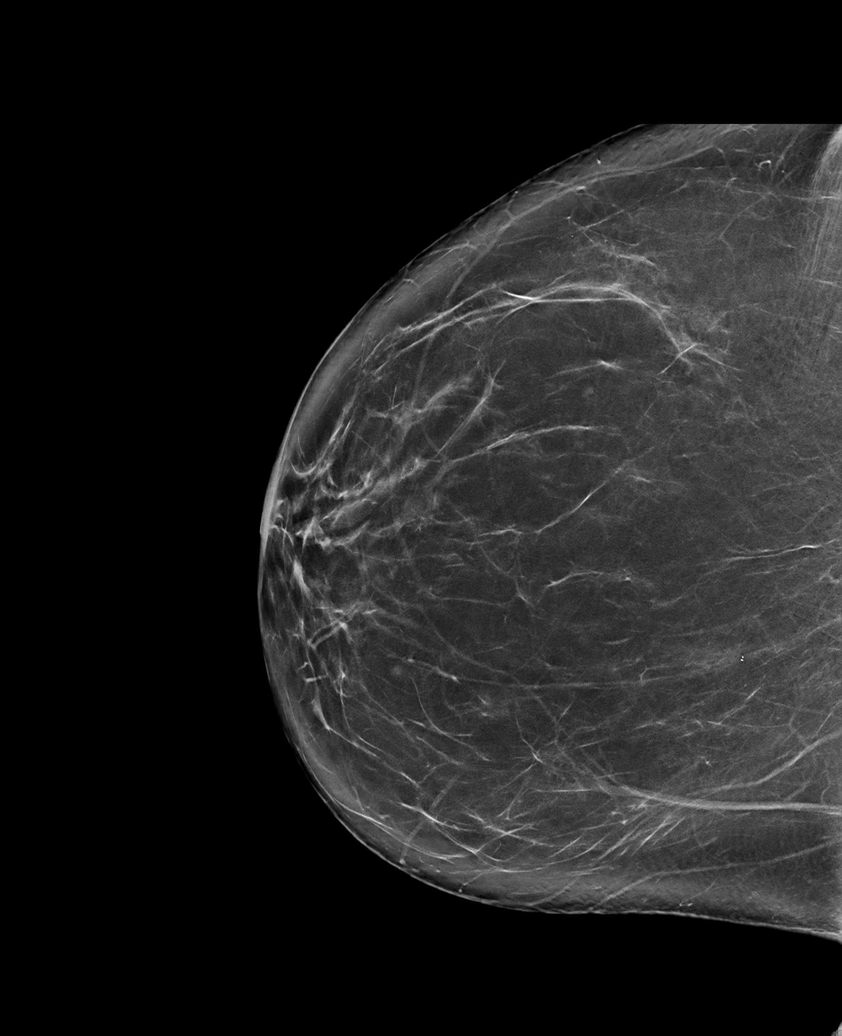

[R MLO tomo · tomo slice 49/96.0]
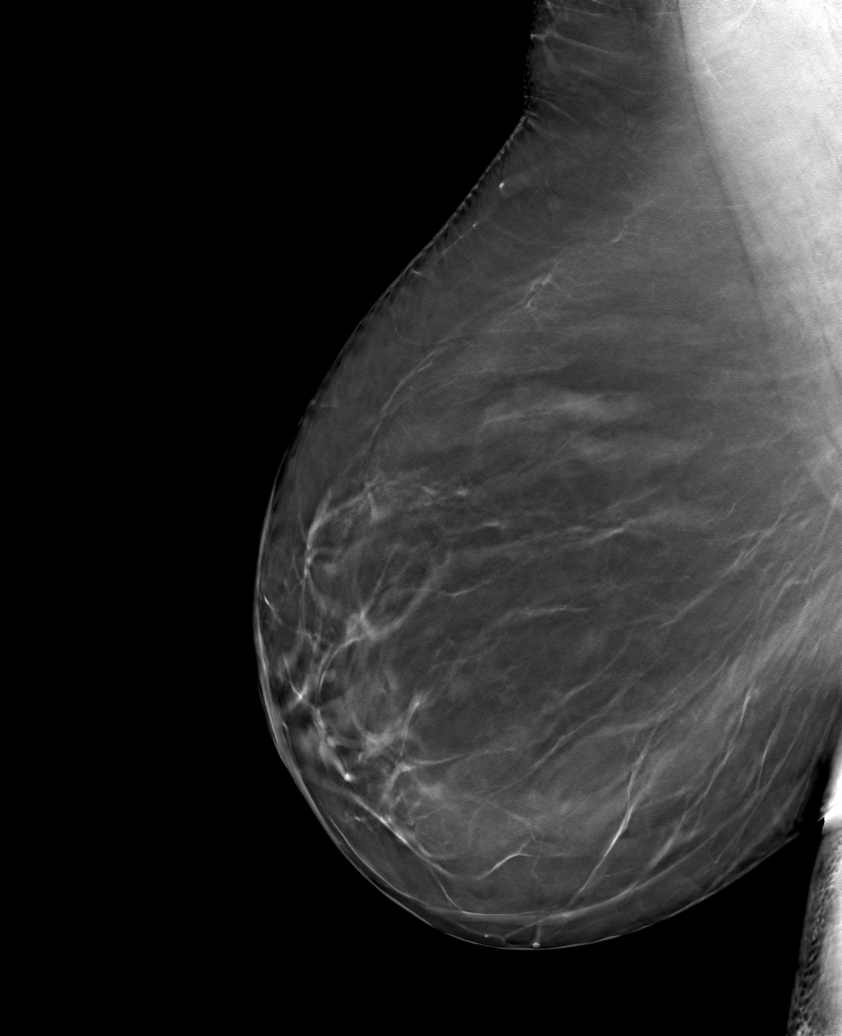

[6 of 30 positions shown; findings below may reference images not displayed]

ACR Breast Density Category b: There are scattered areas of
fibroglandular density.
FINDINGS: There are no findings suspicious for malignancy.
IMPRESSION: No mammographic evidence of malignancy. A result letter of this
screening mammogram will be mailed directly to the patient.

RECOMMENDATION:
Screening mammogram in one year. (Code:51-O-LD2)

BI-RADS CATEGORY  1: Negative.
